# Patient Record
Sex: Male | Born: 1970 | Race: Black or African American | Hispanic: No | Marital: Married | Smoking: Former smoker
Health system: Southern US, Community
[De-identification: ages and names within clinical notes are randomized; demographics above are authoritative.]

## PROBLEM LIST (undated history)

## (undated) DIAGNOSIS — R079 Chest pain, unspecified: Secondary | ICD-10-CM

## (undated) DIAGNOSIS — K219 Gastro-esophageal reflux disease without esophagitis: Secondary | ICD-10-CM

## (undated) DIAGNOSIS — S86819A Strain of other muscle(s) and tendon(s) at lower leg level, unspecified leg, initial encounter: Secondary | ICD-10-CM

## (undated) DIAGNOSIS — G4730 Sleep apnea, unspecified: Secondary | ICD-10-CM

## (undated) DIAGNOSIS — F4310 Post-traumatic stress disorder, unspecified: Secondary | ICD-10-CM

## (undated) DIAGNOSIS — J309 Allergic rhinitis, unspecified: Secondary | ICD-10-CM

## (undated) DIAGNOSIS — J45909 Unspecified asthma, uncomplicated: Secondary | ICD-10-CM

## (undated) HISTORY — DX: Sleep apnea, unspecified: G47.30

## (undated) HISTORY — DX: Allergic rhinitis, unspecified: J30.9

## (undated) HISTORY — DX: Gastro-esophageal reflux disease without esophagitis: K21.9

## (undated) HISTORY — PX: UPPER GI ENDOSCOPY: SHX6162

---

## 2001-12-04 ENCOUNTER — Ambulatory Visit (HOSPITAL_COMMUNITY): Admission: RE | Admit: 2001-12-04 | Discharge: 2001-12-04 | Payer: Self-pay | Admitting: Specialist

## 2001-12-04 ENCOUNTER — Encounter: Payer: Self-pay | Admitting: Specialist

## 2013-11-15 ENCOUNTER — Encounter (HOSPITAL_BASED_OUTPATIENT_CLINIC_OR_DEPARTMENT_OTHER): Payer: Self-pay | Admitting: Emergency Medicine

## 2013-11-15 DIAGNOSIS — Y92838 Other recreation area as the place of occurrence of the external cause: Secondary | ICD-10-CM

## 2013-11-15 DIAGNOSIS — S5000XA Contusion of unspecified elbow, initial encounter: Secondary | ICD-10-CM | POA: Insufficient documentation

## 2013-11-15 DIAGNOSIS — Y9367 Activity, basketball: Secondary | ICD-10-CM | POA: Insufficient documentation

## 2013-11-15 DIAGNOSIS — W219XXA Striking against or struck by unspecified sports equipment, initial encounter: Secondary | ICD-10-CM | POA: Insufficient documentation

## 2013-11-15 DIAGNOSIS — S51009A Unspecified open wound of unspecified elbow, initial encounter: Secondary | ICD-10-CM | POA: Insufficient documentation

## 2013-11-15 DIAGNOSIS — Y9239 Other specified sports and athletic area as the place of occurrence of the external cause: Secondary | ICD-10-CM | POA: Insufficient documentation

## 2013-11-15 DIAGNOSIS — Z23 Encounter for immunization: Secondary | ICD-10-CM | POA: Insufficient documentation

## 2013-11-15 NOTE — ED Notes (Signed)
Laceration to his right elbow when he hit a piece of metal while playing basketball. Bleeding controlled on arrival.

## 2013-11-16 ENCOUNTER — Emergency Department (HOSPITAL_BASED_OUTPATIENT_CLINIC_OR_DEPARTMENT_OTHER)
Admission: EM | Admit: 2013-11-16 | Discharge: 2013-11-16 | Disposition: A | Discharge status: Home / Self Care | Payer: Managed Care, Other (non HMO) | Attending: Emergency Medicine | Admitting: Emergency Medicine

## 2013-11-16 DIAGNOSIS — S5001XA Contusion of right elbow, initial encounter: Secondary | ICD-10-CM

## 2013-11-16 DIAGNOSIS — S51011A Laceration without foreign body of right elbow, initial encounter: Secondary | ICD-10-CM

## 2013-11-16 MED ORDER — TETANUS-DIPHTH-ACELL PERTUSSIS 5-2.5-18.5 LF-MCG/0.5 IM SUSP
0.5000 mL | Freq: Once | INTRAMUSCULAR | Status: AC
  Administered 2013-11-16: 0.5 mL via INTRAMUSCULAR
  Filled 2013-11-16: qty 0.5

## 2013-11-16 NOTE — ED Notes (Signed)
Bacitracin applied to left elbow stitches, covered with bandaid

## 2013-11-16 NOTE — ED Provider Notes (Signed)
CSN: [id number]     Arrival date & time 11/15/13  2159 History  This chart was scribed for [name], MD by Ardelia Mems, ED Scribe. This patient was seen in room MH08/MH08 and the patient's care was started at 1:16 AM.  Chief Complaint  Patient presents with  . Laceration    The history is provided by the patient. No language interpreter was used.    HPI Comments: Kevin Arias is a 43 y.o. male who presents to the Emergency Department complaining of a laceration to his right elbow area, just distal to the olecranon, sustained earlier tonight. He states that while playing basketball he hit his elbow against a metal wall. He is unsure of when his last Tetanus vaccination was. He describes his elbow as "sore" but not "painful". The wound is hemostatic as a result of pressure.  He states that he has also had some cough and congestion over the past week which improved after taking Mucinex.   History reviewed. No pertinent past medical history. History reviewed. No pertinent past surgical history. No family history on file. History  Substance Use Topics  . Smoking status: Never Smoker   . Smokeless tobacco: Not on file  . Alcohol Use: Yes    Review of Systems A complete 10 system review of systems was obtained and all systems are negative except as noted in the HPI and PMH.   Allergies  Ciprofloxacin  Home Medications  No current outpatient prescriptions on file.  Triage Vitals: BP 122/72  Pulse 72  Temp(Src) 98.5 F (36.9 C) (Oral)  Resp 18  Ht 6\' 2"  (1.88 m)  Wt 225 lb (102.059 kg)  BMI 28.88 kg/m2  SpO2 98%  Physical Exam General: Well-developed, well-nourished male in no acute distress; appearance consistent with age of record HENT: normocephalic; atraumatic Eyes: pupils equal, round and reactive to light; extraocular muscles intact Neck: supple Heart: regular rate and rhythm; no murmurs, rubs or gallops Lungs: clear to auscultation bilaterally Abdomen: soft;  nondistended; nontender; no masses or hepatosplenomegaly; bowel sounds present Extremities: No deformity; full range of motion; pulses normal Neurologic: RUE is distally NVI; Awake, alert and oriented; motor function intact in all extremities and symmetric; no facial droop;  Skin: Warm and dry; laceration to the right elbow, just distal to the olecranon. There is surrounding tenderness and mild edema to the right elbow area Psychiatric: Normal mood and affect  ED Course  Procedures (including critical care time)  LACERATION REPAIR PROCEDURE NOTE The patient's identification was confirmed and consent was obtained. This procedure was performed by [name], MD at 1:25 AM. Site: right elbow, just distal to the olecranon Sterile procedures observed: yes Anesthetic used (type and amt): 2 mL of 2% lidocaine with epinephrine Suture type/size:4.0 Prolene Length: 3 cm # of Sutures: 5 stitches Technique: simple, interrupted Complexity: simple Antibx ointment applied: Bacitracin Tetanus UTD or ordered: ordered Site anesthetized, irrigated with NS, explored without evidence of foreign body, wound well approximated, site covered with dry, sterile dressing.  Patient tolerated procedure well without complications. Instructions for care discussed verbally and patient provided with additional written instructions for homecare and f/u.  DIAGNOSTIC STUDIES: Oxygen Saturation is 98% on RA, normal by my interpretation.    COORDINATION OF CARE: 1:19 AM- Repaired pt's laceration. Will order a Tetanus vaccination. Pt advised of plan for treatment and pt agrees.  MDM   Final diagnoses:  Laceration of right elbow  Contusion of right elbow    I personally  performed the services described in this documentation, which was scribed in my presence.  The recorded information has been reviewed and is accurate.   Hanley SeamenJohn L Brecken Walth, MD 11/16/13 [phone]

## 2014-05-05 ENCOUNTER — Encounter: Payer: Self-pay | Admitting: *Deleted

## 2014-12-24 ENCOUNTER — Ambulatory Visit: Payer: Self-pay | Admitting: Orthopedic Surgery

## 2014-12-24 NOTE — Patient Instructions (Addendum)
YOUR PROCEDURE IS SCHEDULED ON :  12/26/14  REPORT TO [facility] MAIN ENTRANCE FOLLOW SIGNS TO SHORT STAY CENTER AT : 7:30 AM  CALL THIS NUMBER IF YOU HAVE PROBLEMS THE MORNING OF SURGERY [phone]  REMEMBER:  DO NOT EAT FOOD OR DRINK LIQUIDS AFTER MIDNIGHT  TAKE THESE MEDICINES THE MORNING OF SURGERY: PROTONIX  BRING C PAP MASK AND TUBING TO HOSPITAL  YOU MAY NOT HAVE ANY METAL ON YOUR BODY INCLUDING HAIR PINS AND PIERCING'S. DO NOT WEAR JEWELRY, MAKEUP, LOTIONS, POWDERS OR PERFUMES. DO NOT WEAR NAIL POLISH. DO NOT SHAVE 48 HRS PRIOR TO SURGERY. MEN MAY SHAVE FACE AND NECK.  DO NOT BRING VALUABLES TO HOSPITAL. [facility] IS NOT RESPONSIBLE FOR VALUABLES.  CONTACTS, DENTURES OR PARTIALS MAY NOT BE WORN TO SURGERY. LEAVE SUITCASE IN CAR. CAN BE BROUGHT TO ROOM AFTER SURGERY.  PATIENTS DISCHARGED THE DAY OF SURGERY WILL NOT BE ALLOWED TO DRIVE HOME.  PLEASE READ OVER THE FOLLOWING INSTRUCTION SHEETS _________________________________________________________________________________                                          [facility] - PREPARING FOR SURGERY  Before surgery, you can play an important role.  Because skin is not sterile, your skin needs to be as free of germs as possible.  You can reduce the number of germs on your skin by washing with CHG (chlorahexidine gluconate) soap before surgery.  CHG is an antiseptic cleaner which kills germs and bonds with the skin to continue killing germs even after washing. Please DO NOT use if you have an allergy to CHG or antibacterial soaps.  If your skin becomes reddened/irritated stop using the CHG and inform your nurse when you arrive at Short Stay. Do not shave (including legs and underarms) for at least 48 hours prior to the first CHG shower.  You may shave your face. Please follow these instructions carefully:   1.  Shower with CHG Soap the night before surgery and the  morning of Surgery.   2.  If you choose  to wash your hair, wash your hair first as usual with your  normal  Shampoo.   3.  After you shampoo, rinse your hair and body thoroughly to remove the  shampoo.                                         4.  Use CHG as you would any other liquid soap.  You can apply chg directly  to the skin and wash . Gently wash with scrungie or clean wascloth    5.  Apply the CHG Soap to your body ONLY FROM THE NECK DOWN.   Do not use on open                           Wound or open sores. Avoid contact with eyes, ears mouth and genitals (private parts).                        Genitals (private parts) with your normal soap.              6.  Wash thoroughly, paying special attention to the area where  your surgery  will be performed.   7.  Thoroughly rinse your body with warm water from the neck down.   8.  DO NOT shower/wash with your normal soap after using and rinsing off  the CHG Soap .                9.  Pat yourself dry with a clean towel.             10.  Wear clean night clothes to bed after shower             11.  Place clean sheets on your bed the night of your first shower and do not  sleep with pets.  Day of Surgery : Do not apply any lotions/deodorants the morning of surgery.  Please wear clean clothes to the hospital/surgery center.  FAILURE TO FOLLOW THESE INSTRUCTIONS MAY RESULT IN THE CANCELLATION OF YOUR SURGERY    PATIENT SIGNATURE_________________________________  ______________________________________________________________________     Kevin Arias  An incentive spirometer is a tool that can help keep your lungs clear and active. This tool measures how well you are filling your lungs with each breath. Taking long deep breaths may help reverse or decrease the chance of developing breathing (pulmonary) problems (especially infection) following:  A long period of time when you are unable to move or be active. BEFORE THE PROCEDURE   If the spirometer includes an  indicator to show your best effort, your nurse or respiratory therapist will set it to a desired goal.  If possible, sit up straight or lean slightly forward. Try not to slouch.  Hold the incentive spirometer in an upright position. INSTRUCTIONS FOR USE  1. Sit on the edge of your bed if possible, or sit up as far as you can in bed or on a chair. 2. Hold the incentive spirometer in an upright position. 3. Breathe out normally. 4. Place the mouthpiece in your mouth and seal your lips tightly around it. 5. Breathe in slowly and as deeply as possible, raising the piston or the ball toward the top of the column. 6. Hold your breath for 3-5 seconds or for as long as possible. Allow the piston or ball to fall to the bottom of the column. 7. Remove the mouthpiece from your mouth and breathe out normally. 8. Rest for a few seconds and repeat Steps 1 through 7 at least 10 times every 1-2 hours when you are awake. Take your time and take a few normal breaths between deep breaths. 9. The spirometer may include an indicator to show your best effort. Use the indicator as a goal to work toward during each repetition. 10. After each set of 10 deep breaths, practice coughing to be sure your lungs are clear. If you have an incision (the cut made at the time of surgery), support your incision when coughing by placing a pillow or rolled up towels firmly against it. Once you are able to get out of bed, walk around indoors and cough well. You may stop using the incentive spirometer when instructed by your caregiver.  RISKS AND COMPLICATIONS  Take your time so you do not get dizzy or light-headed.  If you are in pain, you may need to take or ask for pain medication before doing incentive spirometry. It is harder to take a deep breath if you are having pain. AFTER USE  Rest and breathe slowly and easily.  It can be helpful to keep track of a log  of your progress. Your caregiver can provide you with a simple table  to help with this. If you are using the spirometer at home, follow these instructions: SEEK MEDICAL CARE IF:   You are having difficultly using the spirometer.  You have trouble using the spirometer as often as instructed.  Your pain medication is not giving enough relief while using the spirometer.  You develop fever of 100.5 F (38.1 C) or higher. SEEK IMMEDIATE MEDICAL CARE IF:   You cough up bloody sputum that had not been present before.  You develop fever of 102 F (38.9 C) or greater.  You develop worsening pain at or near the incision site. MAKE SURE YOU:   Understand these instructions.  Will watch your condition.  Will get help right away if you are not doing well or get worse. Document Released: 01/03/2007 Document Revised: 11/15/2011 Document Reviewed: 03/06/2007 [facility] Patient Information 2014 [employer] [place].   ________________________________________________________________________

## 2014-12-24 NOTE — H&P (Signed)
Kevin Arias is an 44 y.o. male.   Chief Complaint: R knee pain HPI: The patient is a 44 year old male who presents today for follow up of their knee. The patient is being followed for their left knee pain. They are now 17 day(s) out from injury (DOI 12/03/14). and report their pain level to be mild to moderate. Current treatment includes: bracing (knee immobilizer prn). The following medication has been used for pain control: Tylenol (prn). The patient presents today following MRI.  Kevin Arias follows up. His son plays for [employer]. He is looking at [employer].  He has had his MRI. It does demonstrate an avulsion of the patellar ligament at the tibial tubercle with some chronic changes related to his Osgood-Schlatter's. He is still having pain and difficulty extending the knee.  Past Medical History  Diagnosis Date  . Sleep apnea   . Allergic rhinitis   . GERD (gastroesophageal reflux disease)     No past surgical history on file.  Family History  Problem Relation Age of Onset  . Hypertension Mother   . Hypertension Sister   . Anuerysm Sister    Social History:  reports that he has quit smoking. He does not have any smokeless tobacco history on file. He reports that he drinks alcohol. He reports that he does not use illicit drugs.  Allergies:  Allergies  Allergen Reactions  . Ciprofloxacin     GERD     (Not in a hospital admission)  No results found for this or any previous visit (from the past 48 hour(s)). No results found.  Review of Systems  Constitutional: Negative.   HENT: Negative.   Eyes: Negative.   Respiratory: Negative.   Cardiovascular: Negative.   Gastrointestinal: Negative.   Genitourinary: Negative.   Musculoskeletal: Positive for joint pain.  Skin: Negative.   Neurological: Negative.   Psychiatric/Behavioral: Negative.     There were no vitals taken for this visit. Physical Exam  Constitutional: He is oriented to person, place, and  time. He appears well-developed and well-nourished.  HENT:  Head: Normocephalic and atraumatic.  Eyes: Conjunctivae and EOM are normal. Pupils are equal, round, and reactive to light.  Neck: Normal range of motion. Neck supple.  Cardiovascular: Normal rate and regular rhythm.   Respiratory: Effort normal and breath sounds normal.  GI: Soft. Bowel sounds are normal.  Musculoskeletal:  He is exquisitely tender to insertion of the patellar ligament and difficulty extending the knee. There is swelling over the insertion of the patellar ligament in comparison to the contralateral side.  Neurological: He is alert and oriented to person, place, and time. He has normal reflexes.  Skin: Skin is warm and dry.  Psychiatric: He has a normal mood and affect.    MRI reviewed. There is longitudinal splitting of the patellar ligament.  Assessment/Plan Split tear of the patellar ligament, partial avulsion, persistent pain three weeks status post. The patient felt a definite pop at the time of the injury.  Given some separation of the patellar ligament from the tibial tubercle and some avulsion as well as longitudinal split tears, it would be reasonable to consider a repair with a bone anchor to the tibial tubercle to assist in healing and the long-term strength of the patellar ligament. He would like proceed with. I have discussed risks and benefits including bleeding, infection, DVT, prolonged need for healing, etc with a history of a heart attack, DVT, CVA or MRSA exposure. Again his son goes to [employer]. He is looking at [employer] to play football.  Plan repair patellar tendon left knee  Legend Tumminello M. PA-C for Dr. Beane 12/24/2014, 10:37 AM    

## 2014-12-25 ENCOUNTER — Ambulatory Visit: Payer: Self-pay | Admitting: Orthopedic Surgery

## 2014-12-25 ENCOUNTER — Encounter (HOSPITAL_COMMUNITY): Payer: Self-pay

## 2014-12-25 ENCOUNTER — Encounter (HOSPITAL_COMMUNITY)
Admission: RE | Admit: 2014-12-25 | Discharge: 2014-12-25 | Disposition: A | Discharge status: Home / Self Care | Payer: Managed Care, Other (non HMO) | Source: Ambulatory Visit | Attending: Specialist | Admitting: Specialist

## 2014-12-25 DIAGNOSIS — X58XXXA Exposure to other specified factors, initial encounter: Secondary | ICD-10-CM | POA: Diagnosis not present

## 2014-12-25 DIAGNOSIS — K219 Gastro-esophageal reflux disease without esophagitis: Secondary | ICD-10-CM | POA: Diagnosis not present

## 2014-12-25 DIAGNOSIS — M719 Bursopathy, unspecified: Secondary | ICD-10-CM | POA: Diagnosis not present

## 2014-12-25 DIAGNOSIS — J45909 Unspecified asthma, uncomplicated: Secondary | ICD-10-CM | POA: Diagnosis not present

## 2014-12-25 DIAGNOSIS — Z79899 Other long term (current) drug therapy: Secondary | ICD-10-CM | POA: Diagnosis not present

## 2014-12-25 DIAGNOSIS — G4730 Sleep apnea, unspecified: Secondary | ICD-10-CM | POA: Diagnosis not present

## 2014-12-25 DIAGNOSIS — S76112A Strain of left quadriceps muscle, fascia and tendon, initial encounter: Secondary | ICD-10-CM | POA: Diagnosis not present

## 2014-12-25 DIAGNOSIS — M25562 Pain in left knee: Secondary | ICD-10-CM | POA: Diagnosis present

## 2014-12-25 DIAGNOSIS — Z87891 Personal history of nicotine dependence: Secondary | ICD-10-CM | POA: Diagnosis not present

## 2014-12-25 HISTORY — DX: Chest pain, unspecified: R07.9

## 2014-12-25 HISTORY — DX: Unspecified asthma, uncomplicated: J45.909

## 2014-12-25 HISTORY — DX: Post-traumatic stress disorder, unspecified: F43.10

## 2014-12-25 HISTORY — DX: Strain of other muscle(s) and tendon(s) at lower leg level, unspecified leg, initial encounter: S86.819A

## 2014-12-25 LAB — CBC
HEMATOCRIT: 42.4 % (ref 39.0–52.0)
HEMOGLOBIN: 13.4 g/dL (ref 13.0–17.0)
MCH: 20.5 pg — ABNORMAL LOW (ref 26.0–34.0)
MCHC: 31.6 g/dL (ref 30.0–36.0)
MCV: 64.9 fL — ABNORMAL LOW (ref 78.0–100.0)
PLATELETS: 175 10*3/uL (ref 150–400)
RBC: 6.53 MIL/uL — AB (ref 4.22–5.81)
RDW: 16.8 % — AB (ref 11.5–15.5)
WBC: 5 10*3/uL (ref 4.0–10.5)

## 2014-12-26 ENCOUNTER — Ambulatory Visit (HOSPITAL_COMMUNITY)
Admission: RE | Admit: 2014-12-26 | Discharge: 2014-12-26 | Disposition: A | Discharge status: Home / Self Care | Payer: Managed Care, Other (non HMO) | Source: Ambulatory Visit | Attending: Specialist | Admitting: Specialist

## 2014-12-26 ENCOUNTER — Encounter (HOSPITAL_COMMUNITY)
Admission: RE | Disposition: A | Discharge status: Home / Self Care | Payer: Self-pay | Source: Ambulatory Visit | Attending: Specialist

## 2014-12-26 ENCOUNTER — Encounter (HOSPITAL_COMMUNITY): Payer: Self-pay | Admitting: *Deleted

## 2014-12-26 ENCOUNTER — Ambulatory Visit (HOSPITAL_COMMUNITY): Payer: Managed Care, Other (non HMO) | Admitting: Certified Registered Nurse Anesthetist

## 2014-12-26 DIAGNOSIS — S76112A Strain of left quadriceps muscle, fascia and tendon, initial encounter: Secondary | ICD-10-CM | POA: Insufficient documentation

## 2014-12-26 DIAGNOSIS — K219 Gastro-esophageal reflux disease without esophagitis: Secondary | ICD-10-CM | POA: Insufficient documentation

## 2014-12-26 DIAGNOSIS — M719 Bursopathy, unspecified: Secondary | ICD-10-CM | POA: Insufficient documentation

## 2014-12-26 DIAGNOSIS — S86892A Other injury of other muscle(s) and tendon(s) at lower leg level, left leg, initial encounter: Secondary | ICD-10-CM

## 2014-12-26 DIAGNOSIS — Z79899 Other long term (current) drug therapy: Secondary | ICD-10-CM | POA: Insufficient documentation

## 2014-12-26 DIAGNOSIS — J45909 Unspecified asthma, uncomplicated: Secondary | ICD-10-CM | POA: Insufficient documentation

## 2014-12-26 DIAGNOSIS — X58XXXA Exposure to other specified factors, initial encounter: Secondary | ICD-10-CM | POA: Insufficient documentation

## 2014-12-26 DIAGNOSIS — G4730 Sleep apnea, unspecified: Secondary | ICD-10-CM | POA: Insufficient documentation

## 2014-12-26 DIAGNOSIS — S86912A Strain of unspecified muscle(s) and tendon(s) at lower leg level, left leg, initial encounter: Secondary | ICD-10-CM

## 2014-12-26 DIAGNOSIS — Z87891 Personal history of nicotine dependence: Secondary | ICD-10-CM | POA: Insufficient documentation

## 2014-12-26 HISTORY — DX: Other injury of other muscle(s) and tendon(s) at lower leg level, left leg, initial encounter: S86.892A

## 2014-12-26 HISTORY — PX: PATELLAR TENDON REPAIR: SHX737

## 2014-12-26 SURGERY — REPAIR, TENDON, PATELLAR
Anesthesia: General | Site: Knee | Laterality: Left

## 2014-12-26 MED ORDER — MIDAZOLAM HCL 5 MG/5ML IJ SOLN
INTRAMUSCULAR | Status: DC | PRN
  Administered 2014-12-26: 2 mg via INTRAVENOUS

## 2014-12-26 MED ORDER — METHOCARBAMOL 500 MG PO TABS
500.0000 mg | ORAL_TABLET | Freq: Three times a day (TID) | ORAL | Status: DC
  Administered 2014-12-26: 500 mg via ORAL
  Filled 2014-12-26: qty 1

## 2014-12-26 MED ORDER — DOCUSATE SODIUM 100 MG PO CAPS: 100.0000 mg | ORAL_CAPSULE | Freq: Two times a day (BID) | ORAL | Status: DC | PRN

## 2014-12-26 MED ORDER — CEFAZOLIN SODIUM-DEXTROSE 2-3 GM-% IV SOLR
INTRAVENOUS | Status: AC
  Filled 2014-12-26: qty 50

## 2014-12-26 MED ORDER — ONDANSETRON HCL 4 MG/2ML IJ SOLN
INTRAMUSCULAR | Status: DC | PRN
  Administered 2014-12-26: 4 mg via INTRAVENOUS

## 2014-12-26 MED ORDER — BUPIVACAINE HCL (PF) 0.5 % IJ SOLN
INTRAMUSCULAR | Status: DC | PRN
  Administered 2014-12-26: 5 mL

## 2014-12-26 MED ORDER — SODIUM CHLORIDE 0.9 % IR SOLN
Status: AC
  Filled 2014-12-26: qty 1

## 2014-12-26 MED ORDER — FENTANYL CITRATE (PF) 100 MCG/2ML IJ SOLN: 25.0000 ug | INTRAMUSCULAR | Status: DC | PRN

## 2014-12-26 MED ORDER — EPHEDRINE SULFATE 50 MG/ML IJ SOLN
INTRAMUSCULAR | Status: AC
  Filled 2014-12-26: qty 1

## 2014-12-26 MED ORDER — LIDOCAINE HCL (CARDIAC) 20 MG/ML IV SOLN
INTRAVENOUS | Status: DC | PRN
  Administered 2014-12-26: 100 mg via INTRAVENOUS

## 2014-12-26 MED ORDER — OXYCODONE-ACETAMINOPHEN 5-325 MG PO TABS: 1.0000 | ORAL_TABLET | ORAL | Status: DC | PRN

## 2014-12-26 MED ORDER — METHOCARBAMOL 500 MG PO TABS: 500.0000 mg | ORAL_TABLET | Freq: Three times a day (TID) | ORAL | Status: DC | PRN

## 2014-12-26 MED ORDER — FENTANYL CITRATE (PF) 100 MCG/2ML IJ SOLN
INTRAMUSCULAR | Status: DC | PRN
  Administered 2014-12-26 (×3): 50 ug via INTRAVENOUS

## 2014-12-26 MED ORDER — PROPOFOL 10 MG/ML IV BOLUS
INTRAVENOUS | Status: AC
  Filled 2014-12-26: qty 20

## 2014-12-26 MED ORDER — LACTATED RINGERS IV SOLN
INTRAVENOUS | Status: DC
  Administered 2014-12-26: 09:00:00 via INTRAVENOUS

## 2014-12-26 MED ORDER — LACTATED RINGERS IV SOLN: INTRAVENOUS | Status: DC

## 2014-12-26 MED ORDER — BUPIVACAINE-EPINEPHRINE (PF) 0.5% -1:200000 IJ SOLN
INTRAMUSCULAR | Status: DC | PRN
  Administered 2014-12-26: 13 mL

## 2014-12-26 MED ORDER — MEPERIDINE HCL 50 MG/ML IJ SOLN: 6.2500 mg | INTRAMUSCULAR | Status: DC | PRN

## 2014-12-26 MED ORDER — OXYCODONE-ACETAMINOPHEN 5-325 MG PO TABS
1.0000 | ORAL_TABLET | ORAL | Status: DC | PRN
  Administered 2014-12-26: 1 via ORAL
  Filled 2014-12-26: qty 1

## 2014-12-26 MED ORDER — BUPIVACAINE-EPINEPHRINE (PF) 0.5% -1:200000 IJ SOLN
INTRAMUSCULAR | Status: AC
  Filled 2014-12-26: qty 30

## 2014-12-26 MED ORDER — CEFAZOLIN SODIUM-DEXTROSE 2-3 GM-% IV SOLR
2.0000 g | INTRAVENOUS | Status: AC
  Administered 2014-12-26: 2 g via INTRAVENOUS

## 2014-12-26 MED ORDER — SODIUM CHLORIDE 0.9 % IJ SOLN
INTRAMUSCULAR | Status: AC
  Filled 2014-12-26: qty 10

## 2014-12-26 MED ORDER — PROMETHAZINE HCL 25 MG/ML IJ SOLN: 6.2500 mg | INTRAMUSCULAR | Status: DC | PRN

## 2014-12-26 MED ORDER — PROPOFOL 10 MG/ML IV BOLUS
INTRAVENOUS | Status: DC | PRN
  Administered 2014-12-26: 200 mg via INTRAVENOUS
  Administered 2014-12-26: 50 mg via INTRAVENOUS

## 2014-12-26 MED ORDER — PROPOFOL 10 MG/ML IV BOLUS
INTRAVENOUS | Status: AC
Start: 2014-12-26 — End: 2014-12-26
  Filled 2014-12-26: qty 20

## 2014-12-26 MED ORDER — MIDAZOLAM HCL 2 MG/2ML IJ SOLN
INTRAMUSCULAR | Status: AC
  Filled 2014-12-26: qty 2

## 2014-12-26 MED ORDER — ONDANSETRON HCL 4 MG/2ML IJ SOLN
INTRAMUSCULAR | Status: AC
  Filled 2014-12-26: qty 2

## 2014-12-26 MED ORDER — BUPIVACAINE HCL (PF) 0.5 % IJ SOLN
INTRAMUSCULAR | Status: AC
  Filled 2014-12-26: qty 30

## 2014-12-26 MED ORDER — CEPHALEXIN 500 MG PO CAPS: 500.0000 mg | ORAL_CAPSULE | Freq: Four times a day (QID) | ORAL | Status: DC

## 2014-12-26 MED ORDER — SODIUM CHLORIDE 0.9 % IR SOLN
Status: DC | PRN
  Administered 2014-12-26: 500 mL

## 2014-12-26 MED ORDER — LIDOCAINE HCL (CARDIAC) 20 MG/ML IV SOLN
INTRAVENOUS | Status: AC
  Filled 2014-12-26: qty 5

## 2014-12-26 MED ORDER — KETOROLAC TROMETHAMINE 30 MG/ML IJ SOLN
INTRAMUSCULAR | Status: AC
  Filled 2014-12-26: qty 1

## 2014-12-26 MED ORDER — FENTANYL CITRATE (PF) 250 MCG/5ML IJ SOLN
INTRAMUSCULAR | Status: AC
  Filled 2014-12-26: qty 5

## 2014-12-26 MED ORDER — KETOROLAC TROMETHAMINE 30 MG/ML IJ SOLN
INTRAMUSCULAR | Status: DC | PRN
  Administered 2014-12-26: 30 mg via INTRAVENOUS

## 2014-12-26 SURGICAL SUPPLY — 46 items
ANCHOR ROTATOR CUFF #2 (Anchor) ×2 IMPLANT
BAG SPEC THK2 15X12 ZIP CLS (MISCELLANEOUS) ×1
BAG ZIPLOCK 12X15 (MISCELLANEOUS) ×2 IMPLANT
BANDAGE ELASTIC 6 VELCRO ST LF (GAUZE/BANDAGES/DRESSINGS) IMPLANT
BANDAGE ESMARK 6X9 LF (GAUZE/BANDAGES/DRESSINGS) IMPLANT
BIT DRILL 2.8X128 (BIT) IMPLANT
BNDG ELASTIC 6X15 VLCR STRL LF (GAUZE/BANDAGES/DRESSINGS) ×2 IMPLANT
BNDG ESMARK 6X9 LF (GAUZE/BANDAGES/DRESSINGS)
CLOTH 2% CHLOROHEXIDINE 3PK (PERSONAL CARE ITEMS) ×2 IMPLANT
CUFF TOURN SGL QUICK 34 (TOURNIQUET CUFF) ×1
CUFF TRNQT CYL 34X4X40X1 (TOURNIQUET CUFF) ×1 IMPLANT
DRAPE EXTREMITY T 121X128X90 (DRAPE) ×2 IMPLANT
DRAPE POUCH INSTRU U-SHP 10X18 (DRAPES) ×2 IMPLANT
DRAPE U-SHAPE 47X51 STRL (DRAPES) ×2 IMPLANT
DRSG ADAPTIC 3X8 NADH LF (GAUZE/BANDAGES/DRESSINGS) IMPLANT
DRSG AQUACEL AG ADV 3.5X 4 (GAUZE/BANDAGES/DRESSINGS) ×2 IMPLANT
DRSG PAD ABDOMINAL 8X10 ST (GAUZE/BANDAGES/DRESSINGS) IMPLANT
DURAPREP 26ML APPLICATOR (WOUND CARE) ×2 IMPLANT
ELECT REM PT RETURN 9FT ADLT (ELECTROSURGICAL) ×2
ELECTRODE REM PT RTRN 9FT ADLT (ELECTROSURGICAL) ×1 IMPLANT
GAUZE SPONGE 4X4 12PLY STRL (GAUZE/BANDAGES/DRESSINGS) IMPLANT
GLOVE BIOGEL PI IND STRL 7.5 (GLOVE) ×1 IMPLANT
GLOVE BIOGEL PI IND STRL 8 (GLOVE) ×1 IMPLANT
GLOVE BIOGEL PI INDICATOR 7.5 (GLOVE) ×1
GLOVE BIOGEL PI INDICATOR 8 (GLOVE) ×1
GLOVE SURG SS PI 7.5 STRL IVOR (GLOVE) ×2 IMPLANT
GLOVE SURG SS PI 8.0 STRL IVOR (GLOVE) ×4 IMPLANT
GOWN STRL REUS W/TWL XL LVL3 (GOWN DISPOSABLE) ×4 IMPLANT
IMMOBILIZER KNEE 22 (SOFTGOODS) ×2 IMPLANT
MANIFOLD NEPTUNE II (INSTRUMENTS) ×2 IMPLANT
NEEDLE HYPO 21X1.5 SAFETY (NEEDLE) ×2 IMPLANT
PACK TOTAL JOINT (CUSTOM PROCEDURE TRAY) ×2 IMPLANT
PASSER SUT SWANSON 36MM LOOP (INSTRUMENTS) IMPLANT
POSITIONER SURGICAL ARM (MISCELLANEOUS) ×2 IMPLANT
STAPLER VISISTAT 35W (STAPLE) IMPLANT
STRIP CLOSURE SKIN 1/2X4 (GAUZE/BANDAGES/DRESSINGS) ×2 IMPLANT
SUT FIBERWIRE #2 38 T-5 BLUE (SUTURE)
SUT MNCRL AB 4-0 PS2 18 (SUTURE) IMPLANT
SUT PROLENE 3 0 FS 2 (SUTURE) ×2 IMPLANT
SUT VIC AB 1 CT1 27 (SUTURE) ×2
SUT VIC AB 1 CT1 27XBRD ANTBC (SUTURE) ×2 IMPLANT
SUT VIC AB 2-0 CT1 27 (SUTURE) ×2
SUT VIC AB 2-0 CT1 TAPERPNT 27 (SUTURE) ×2 IMPLANT
SUTURE FIBERWR #2 38 T-5 BLUE (SUTURE) IMPLANT
SYR 20CC LL (SYRINGE) ×2 IMPLANT
WRAP KNEE MAXI GEL POST OP (GAUZE/BANDAGES/DRESSINGS) ×2 IMPLANT

## 2014-12-26 NOTE — H&P (View-Only) (Signed)
Kevin Arias is an 43 y.o. male.   Chief Complaint: R knee pain HPI: The patient is a 44 year old male who presents today for follow up of their knee. The patient is being followed for their left knee pain. They are now 17 day(s) out from injury (DOI 12/03/14). and report their pain level to be mild to moderate. Current treatment includes: bracing (knee immobilizer prn). The following medication has been used for pain control: Tylenol (prn). The patient presents today following MRI.  Kevin Arias follows up. His son plays for [employer]. He is looking at [employer].  He has had his MRI. It does demonstrate an avulsion of the patellar ligament at the tibial tubercle with some chronic changes related to his Osgood-Schlatter's. He is still having pain and difficulty extending the knee.  Past Medical History  Diagnosis Date  . Sleep apnea   . Allergic rhinitis   . GERD (gastroesophageal reflux disease)     No past surgical history on file.  Family History  Problem Relation Age of Onset  . Hypertension Mother   . Hypertension Sister   . Anuerysm Sister    Social History:  reports that he has quit smoking. He does not have any smokeless tobacco history on file. He reports that he drinks alcohol. He reports that he does not use illicit drugs.  Allergies:  Allergies  Allergen Reactions  . Ciprofloxacin     GERD     (Not in a hospital admission)  No results found for this or any previous visit (from the past 48 hour(s)). No results found.  Review of Systems  Constitutional: Negative.   HENT: Negative.   Eyes: Negative.   Respiratory: Negative.   Cardiovascular: Negative.   Gastrointestinal: Negative.   Genitourinary: Negative.   Musculoskeletal: Positive for joint pain.  Skin: Negative.   Neurological: Negative.   Psychiatric/Behavioral: Negative.     There were no vitals taken for this visit. Physical Exam  Constitutional: He is oriented to person, place, and  time. He appears well-developed and well-nourished.  HENT:  Head: Normocephalic and atraumatic.  Eyes: Conjunctivae and EOM are normal. Pupils are equal, round, and reactive to light.  Neck: Normal range of motion. Neck supple.  Cardiovascular: Normal rate and regular rhythm.   Respiratory: Effort normal and breath sounds normal.  GI: Soft. Bowel sounds are normal.  Musculoskeletal:  He is exquisitely tender to insertion of the patellar ligament and difficulty extending the knee. There is swelling over the insertion of the patellar ligament in comparison to the contralateral side.  Neurological: He is alert and oriented to person, place, and time. He has normal reflexes.  Skin: Skin is warm and dry.  Psychiatric: He has a normal mood and affect.    MRI reviewed. There is longitudinal splitting of the patellar ligament.  Assessment/Plan Split tear of the patellar ligament, partial avulsion, persistent pain three weeks status post. The patient felt a definite pop at the time of the injury.  Given some separation of the patellar ligament from the tibial tubercle and some avulsion as well as longitudinal split tears, it would be reasonable to consider a repair with a bone anchor to the tibial tubercle to assist in healing and the long-term strength of the patellar ligament. He would like proceed with. I have discussed risks and benefits including bleeding, infection, DVT, prolonged need for healing, etc with a history of a heart attack, DVT, CVA or MRSA exposure. Again his son goes [employer]. [facility] [employer] [facility].  Plan repair patellar tendon left knee  Kevin Arias M. PA-C for Dr. Shelle IronBeane 12/24/2014, 10:37 AM

## 2014-12-26 NOTE — Progress Notes (Signed)
O.K.  For patient to go back to Short Stay after patient is in PACU for one hour- -per Dr. Acey Lavarignan.

## 2014-12-26 NOTE — Anesthesia Preprocedure Evaluation (Addendum)
Anesthesia Evaluation  Patient identified by MRN, date of birth, ID band Patient awake    Reviewed: Allergy & Precautions, NPO status , Patient's Chart, lab work & pertinent test results  Airway Mallampati: II  TM Distance: >3 FB Neck ROM: Full    Dental no notable dental hx.    Pulmonary asthma , former smoker,  breath sounds clear to auscultation  Pulmonary exam normal       Cardiovascular negative cardio ROS  Rhythm:Regular Rate:Normal     Neuro/Psych negative neurological ROS  negative psych ROS   GI/Hepatic negative GI ROS, Neg liver ROS,   Endo/Other  negative endocrine ROS  Renal/GU negative Renal ROS  negative genitourinary   Musculoskeletal negative musculoskeletal ROS (+)   Abdominal   Peds negative pediatric ROS (+)  Hematology negative hematology ROS (+)   Anesthesia Other Findings   Reproductive/Obstetrics negative OB ROS                            Anesthesia Physical Anesthesia Plan  ASA: II  Anesthesia Plan: General   Post-op Pain Management:    Induction: Intravenous  Airway Management Planned: LMA and Oral ETT  Additional Equipment:   Intra-op Plan:   Post-operative Plan: Extubation in OR  Informed Consent: I have reviewed the patients History and Physical, chart, labs and discussed the procedure including the risks, benefits and alternatives for the proposed anesthesia with the patient or authorized representative who has indicated his/her understanding and acceptance.   Dental advisory given  Plan Discussed with: CRNA  Anesthesia Plan Comments:         Anesthesia Quick Evaluation

## 2014-12-26 NOTE — Anesthesia Procedure Notes (Signed)
Procedure Name: LMA Insertion Date/Time: 12/26/2014 10:10 AM Performed by: Epimenio SarinJARVELA, Kevin Tercero R Pre-anesthesia Checklist: Patient identified, Emergency Drugs available, Suction available, Patient being monitored and Timeout performed Patient Re-evaluated:Patient Re-evaluated prior to inductionOxygen Delivery Method: Circle system utilized Preoxygenation: Pre-oxygenation with 100% oxygen Intubation Type: IV induction Ventilation: Mask ventilation without difficulty LMA: LMA with gastric port inserted LMA Size: 5.0 Number of attempts: 1 Dental Injury: Teeth and Oropharynx as per pre-operative assessment

## 2014-12-26 NOTE — Op Note (Signed)
NAMEJONAS, Kevin Arias                ACCOUNT NO.:  [id number]  MEDICAL RECORD NO.:  [id number]  LOCATION:  [facility]                         FACILITY:  [facility]  PHYSICIAN:  Jene Every, M.D.    DATE OF BIRTH:  17-Jan-1971  DATE OF PROCEDURE:  12/26/2014 DATE OF DISCHARGE:  12/26/2014                              OPERATIVE REPORT   PREOPERATIVE DIAGNOSES:  Patellar ligament tear, left knee; prepatellar bursitis.  POSTOPERATIVE DIAGNOSES:  Patellar ligament tear, left knee; prepatellar bursitis.  PROCEDURES PERFORMED: 1. Mini open repair of the patellar ligament utilized. 2. Bursal resection.  ANESTHESIA:  General.  ASSISTANT:  Lanna Poche, PA.  HISTORY:  A 44 year old, a knee injury, history of Osgood-Schlatter disease, had an avulsion tear of the patellar ligament at its insertion of tibial tubercle by MRI.  There was longitudinal splitting in the tendon and avulsion characteristics with some bony attachment.  It was indicated for reinforcement attachment of the patellar ligament and the tibial tubercle to prevent complete avulsion.  Risks and benefits discussed including bleeding, infection, suboptimal range of motion, DVT, PE, anesthetic complications, etc.  TECHNIQUE:  With the patient in supine position, after induction of adequate general anesthesia, 2 g Kefzol, left lower extremity was prepped and draped in usual sterile fashion.  The knee was slightly flexed, prominence over the tibial tubercle was noted.  I made a 3-cm incision over the tibial tubercle his previous point of maximal tenderness and swelling.  Electrocautery was utilized to achieve hemostasis.  I excised the bursa noted over the patella ligament.  At point of maximal swelling and where he was point tender, I made a longitudinal incision in the patellar ligament down to the tibial tubercle, midportion of that where the avulsion fragments were noted. Then I took a curette and explored that area.  There  was attachment distally as well as proximally, medially, and laterally, however, there was a central portion that was detached.  I curetted the bone that measured only about a half by half cm, curetted the bone.  I then used a 2-prong Mitek suture anchor and impacted into the bone.  Flushed with the countersunk.  Excellent resistance to pull out.  I then threaded those Ethibond sutures to the patellar ligament medially and laterally to deliver that portion of the tendon to the bone and tied it with a good surgical knot and oversewed the longitudinal split with #1 Vicryl interrupted figure-of-eight sutures.  Full range of motion was noted and detached.  The ligament was noted.  We explored the remainder of the ligament, above the tibial tubercle and to the patellar tendon that was intact.  A good stability of the knee under exam.  Copiously irrigated the wound, infiltrated with 0.25% Marcaine with epinephrine.  Next, we repaired the subcutaneous tissue with 2-0 Vicryl and skin with subcuticular Prolene.  Sterile dressing applied.  No tourniquet was utilized.  He was placed in immobilizer, extubated without difficulty, and transported to the recovery room in satisfactory condition.  The patient tolerated the procedure well.  No complications.  Assistant, Lanna Poche, PA, minimal blood loss.     Jene Every, M.D.     JB/MEDQ  D:  12/26/2014  T:  12/26/2014  Job:  [id number]

## 2014-12-26 NOTE — Discharge Instructions (Signed)
Weight bear as tolerated in immobilizer. Crutches as needed. May remove immobilizer when not weight bearing. May bend the knee to 45 degrees. Keep dressing on until seen in office. Take one 325mg  aspirin per day to decrease risk of blood clot. Percocet or advil for pain. See Dr. Shelle IronBeane in 2 weeks

## 2014-12-26 NOTE — Progress Notes (Signed)
Instructed to use CPAP machine at home post op

## 2014-12-26 NOTE — Anesthesia Postprocedure Evaluation (Signed)
  Anesthesia Post-op Note  Patient: Kevin BullaRalph Mcphearson  Procedure(s) Performed: Procedure(s) (LRB): PATELLA TENDON REPAIR ON THE LEFT (Left)  Patient Location: PACU  Anesthesia Type: General  Level of Consciousness: awake and alert   Airway and Oxygen Therapy: Patient Spontanous Breathing  Post-op Pain: mild  Post-op Assessment: Post-op Vital signs reviewed, Patient's Cardiovascular Status Stable, Respiratory Function Stable, Patent Airway and No signs of Nausea or vomiting  Last Vitals:  Filed Vitals:   12/26/14 1323  BP: 118/68  Pulse: 55  Temp:   Resp: 18    Post-op Vital Signs: stable   Complications: No apparent anesthesia complications

## 2014-12-26 NOTE — Transfer of Care (Signed)
Immediate Anesthesia Transfer of Care Note  Patient: Kevin BullaRalph Coggins  Procedure(s) Performed: Procedure(s): PATELLA TENDON REPAIR ON THE LEFT (Left)  Patient Location: PACU  Anesthesia Type:General  Level of Consciousness:  sedated, patient cooperative and responds to stimulation  Airway & Oxygen Therapy:Patient Spontanous Breathing and Patient connected to face mask oxgen  Post-op Assessment:  Report given to PACU RN and Post -op Vital signs reviewed and stable  Post vital signs:  Reviewed and stable  Last Vitals:  Filed Vitals:   12/26/14 0731  BP: 136/75  Pulse: 68  Temp: 36.7 C  Resp: 18    Complications: No apparent anesthesia complications

## 2014-12-26 NOTE — Brief Op Note (Signed)
12/26/2014  10:44 AM  PATIENT:  Kevin Arias  44 y.o. male  PRE-OPERATIVE DIAGNOSIS:  patella tendon tear on the left  POST-OPERATIVE DIAGNOSIS:  patella tendon tear on the left  PROCEDURE:  Procedure(s): PATELLA TENDON REPAIR ON THE LEFT (Left)  SURGEON:  Surgeon(s) and Role:    * Jene EveryJeffrey Mikias Lanz, MD - Primary  PHYSICIAN ASSISTANT:   ASSISTANTS: Bissell   ANESTHESIA:   general  EBL:     BLOOD ADMINISTERED:none  DRAINS: none   LOCAL MEDICATIONS USED:  MARCAINE     SPECIMEN:  No Specimen  DISPOSITION OF SPECIMEN:  no  COUNTS:  YES  TOURNIQUET:    DICTATION: .Other Dictation: Dictation Number [id number]  PLAN OF CARE: Discharge to home after PACU  PATIENT DISPOSITION:  PACU - hemodynamically stable.   Delay start of Pharmacological VTE agent (>24hrs) due to surgical blood loss or risk of bleeding: no

## 2014-12-26 NOTE — Interval H&P Note (Signed)
History and Physical Interval Note:  12/26/2014 7:31 AM  Kevin Arias  has presented today for surgery, with the diagnosis of patella tendon tear on the left  The various methods of treatment have been discussed with the patient and family. After consideration of risks, benefits and other options for treatment, the patient has consented to  Procedure(s): PATELLA TENDON REPAIR ON THE LEFT (Left) as a surgical intervention .  The patient's history has been reviewed, patient examined, no change in status, stable for surgery.  I have reviewed the patient's chart and labs.  Questions were answered to the patient's satisfaction.     [name] C

## 2014-12-27 ENCOUNTER — Encounter (HOSPITAL_COMMUNITY): Payer: Self-pay | Admitting: Specialist

## 2015-07-08 ENCOUNTER — Other Ambulatory Visit: Payer: Self-pay | Admitting: Family Medicine

## 2015-07-08 DIAGNOSIS — R1013 Epigastric pain: Secondary | ICD-10-CM

## 2015-07-16 ENCOUNTER — Other Ambulatory Visit: Payer: Managed Care, Other (non HMO)

## 2015-07-24 ENCOUNTER — Ambulatory Visit
Admission: RE | Admit: 2015-07-24 | Discharge: 2015-07-24 | Disposition: A | Discharge status: Home / Self Care | Payer: 59 | Source: Ambulatory Visit | Attending: Family Medicine | Admitting: Family Medicine

## 2015-07-24 DIAGNOSIS — R1013 Epigastric pain: Secondary | ICD-10-CM

## 2015-07-30 ENCOUNTER — Other Ambulatory Visit: Payer: Self-pay | Admitting: Family Medicine

## 2015-07-30 DIAGNOSIS — K7689 Other specified diseases of liver: Secondary | ICD-10-CM

## 2015-07-30 DIAGNOSIS — N2889 Other specified disorders of kidney and ureter: Secondary | ICD-10-CM

## 2015-07-30 DIAGNOSIS — T1590XA Foreign body on external eye, part unspecified, unspecified eye, initial encounter: Secondary | ICD-10-CM

## 2015-08-12 ENCOUNTER — Ambulatory Visit
Admission: RE | Admit: 2015-08-12 | Discharge: 2015-08-12 | Disposition: A | Discharge status: Home / Self Care | Payer: 59 | Source: Ambulatory Visit | Attending: Family Medicine | Admitting: Family Medicine

## 2015-08-12 DIAGNOSIS — K7689 Other specified diseases of liver: Secondary | ICD-10-CM

## 2015-08-12 DIAGNOSIS — T1590XA Foreign body on external eye, part unspecified, unspecified eye, initial encounter: Secondary | ICD-10-CM

## 2015-08-12 DIAGNOSIS — N2889 Other specified disorders of kidney and ureter: Secondary | ICD-10-CM

## 2015-08-12 MED ORDER — GADOBENATE DIMEGLUMINE 529 MG/ML IV SOLN
20.0000 mL | Freq: Once | INTRAVENOUS | Status: AC | PRN
  Administered 2015-08-12: 20 mL via INTRAVENOUS

## 2016-12-27 ENCOUNTER — Other Ambulatory Visit: Payer: Self-pay | Admitting: Cardiology

## 2016-12-27 ENCOUNTER — Ambulatory Visit
Admission: RE | Admit: 2016-12-27 | Discharge: 2016-12-27 | Disposition: A | Discharge status: Home / Self Care | Payer: 59 | Source: Ambulatory Visit | Attending: Cardiology | Admitting: Cardiology

## 2016-12-27 DIAGNOSIS — R0789 Other chest pain: Secondary | ICD-10-CM

## 2017-03-17 IMAGING — US US ABDOMEN COMPLETE
1 series · 13 of 25 positions shown · non-contrast
Comparison: None.

CLINICAL DATA: Epigastric pain

EXAM:
ULTRASOUND ABDOMEN COMPLETE

[Series 1: us abdomen complete · 0.32mm/px · 13 of 129 slices shown]
[im 1/129]
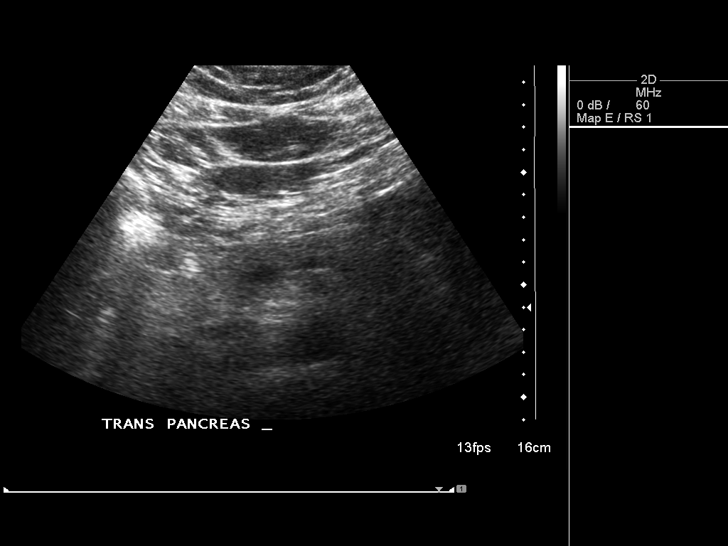
[im 11/129]
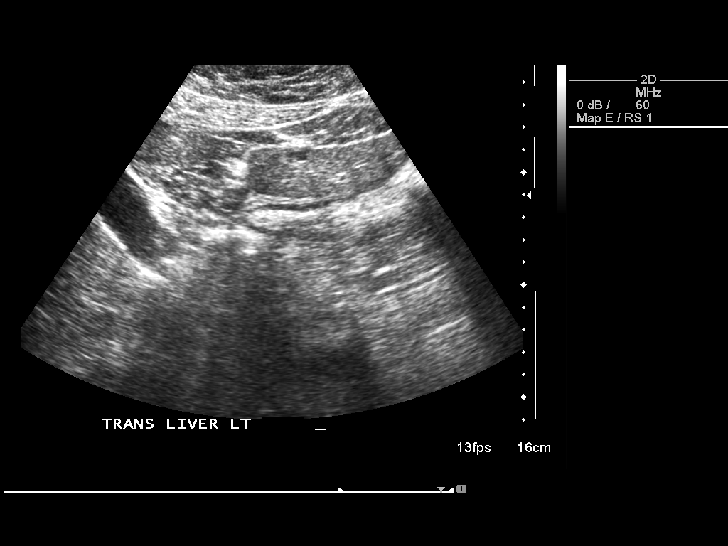
[im 22/129]
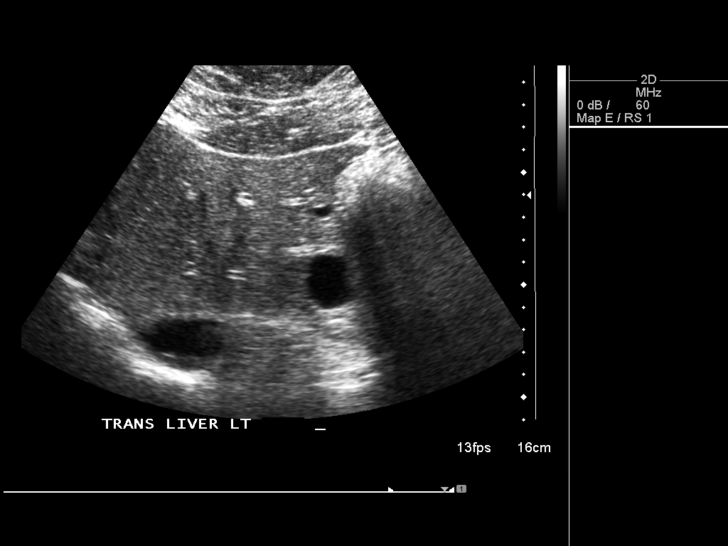
[im 33/129]
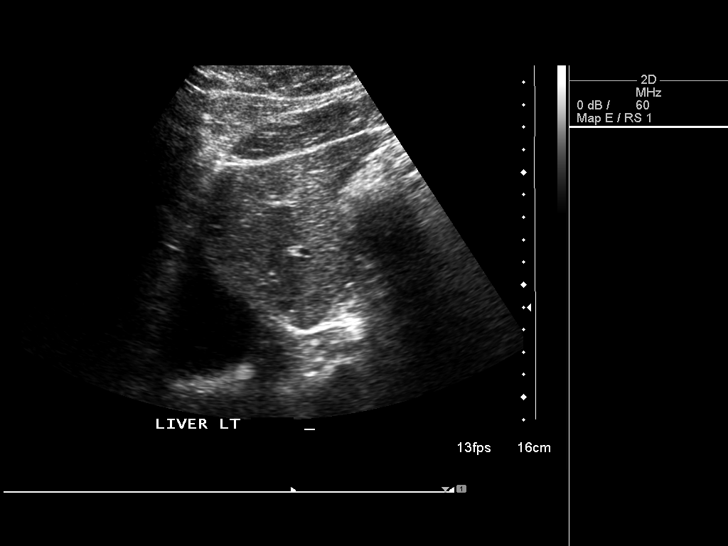
[im 43/129]
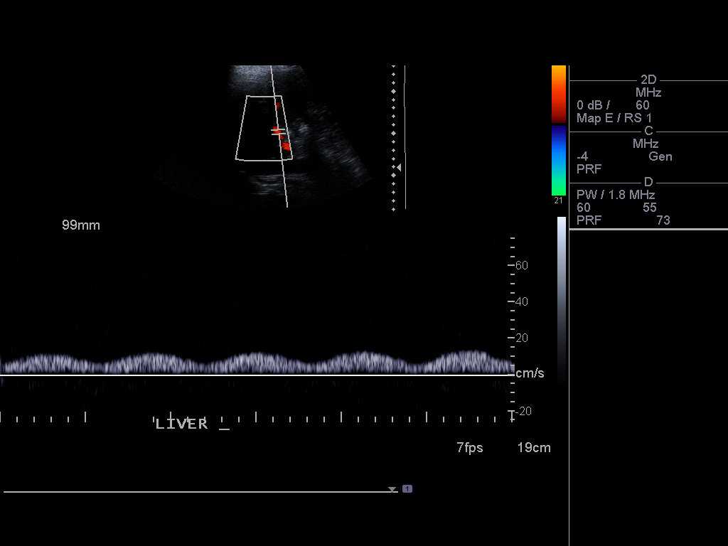
[im 54/129]
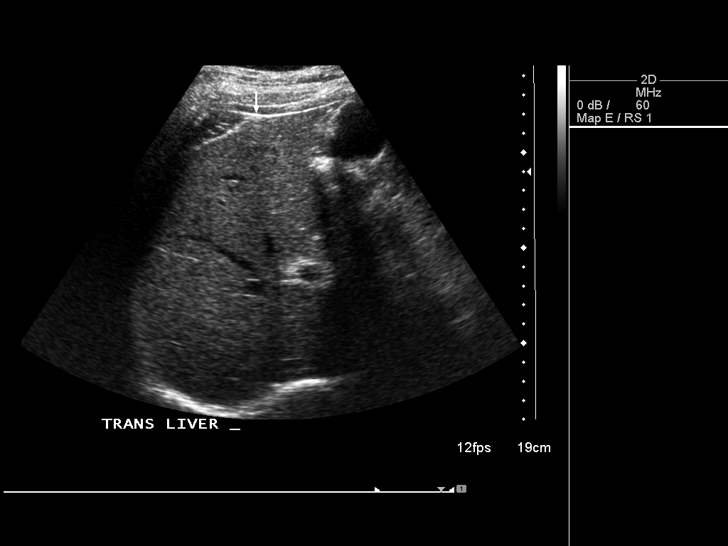
[im 65/129]
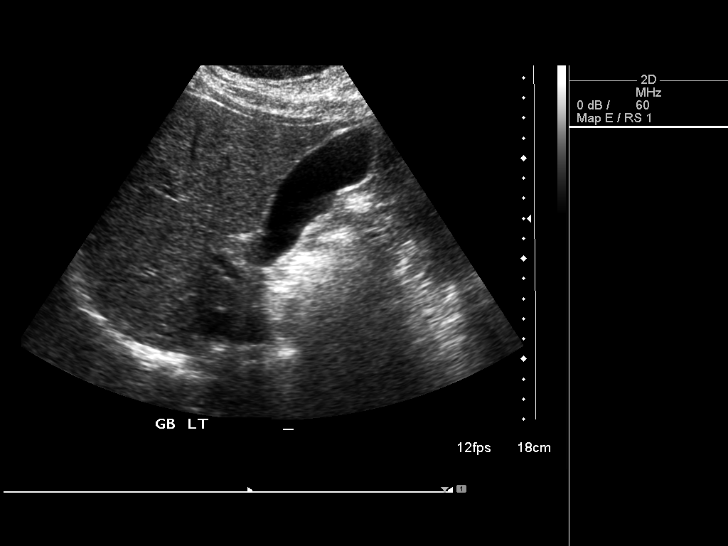
[im 75/129]
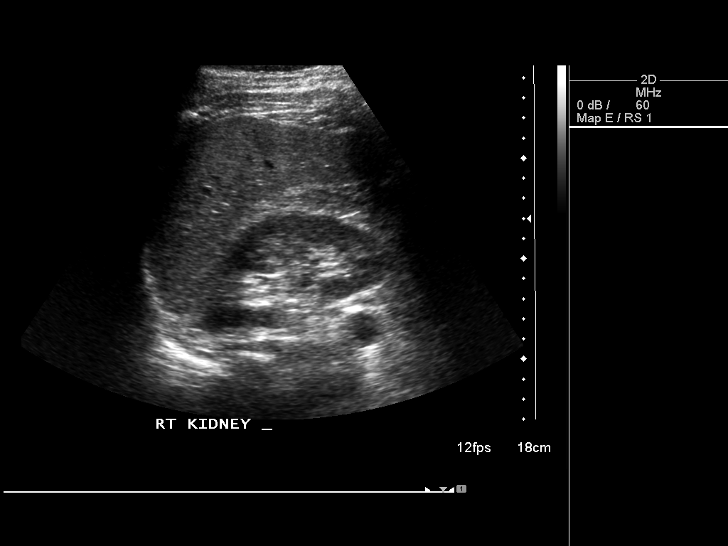
[im 86/129]
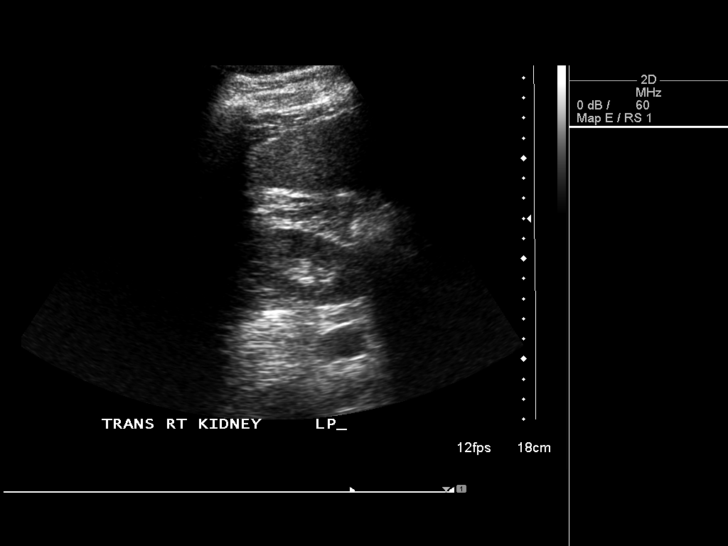
[im 97/129]
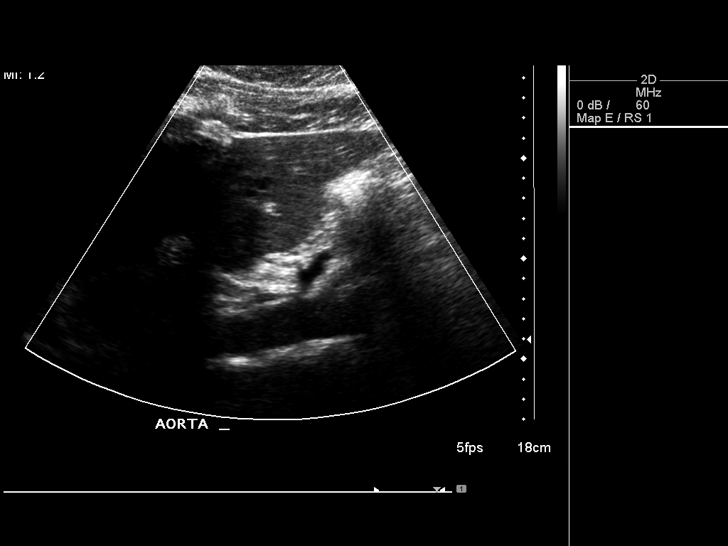
[im 107/129]
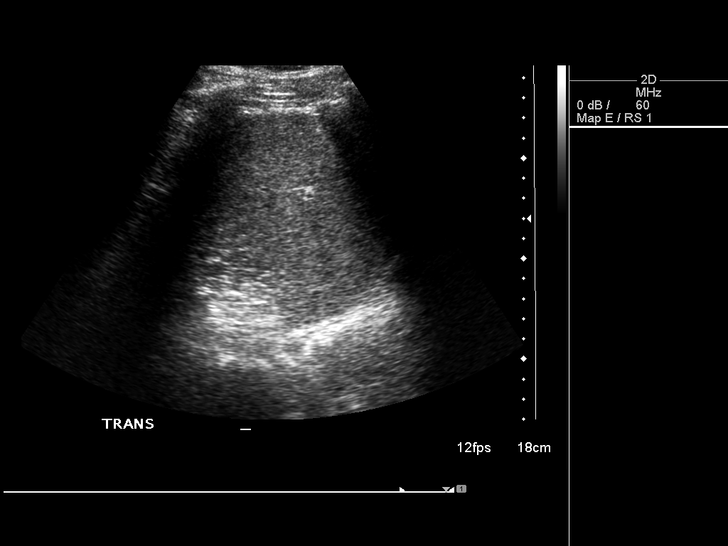
[im 118/129]
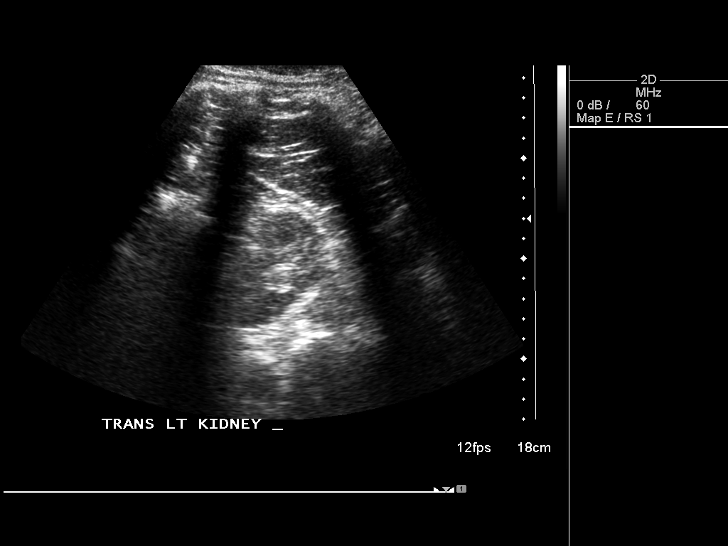
[im 129/129]
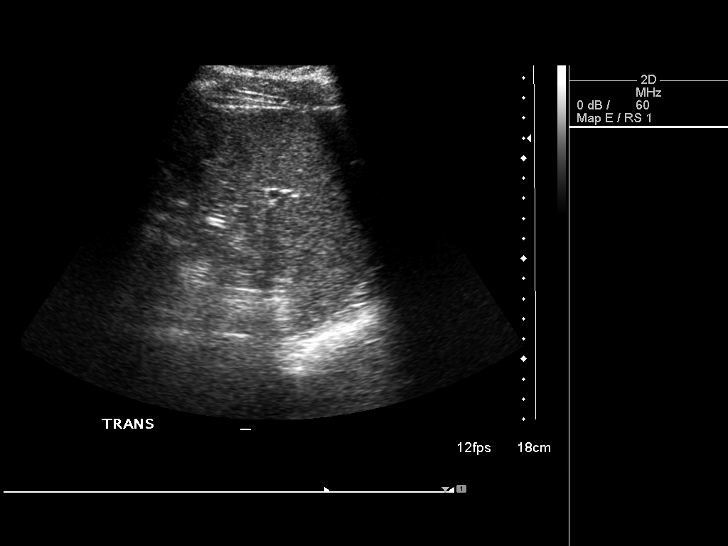

[13 of 25 positions shown; findings below may reference images not displayed]

FINDINGS: Gallbladder: No gallstones or wall thickening visualized. No
sonographic Murphy sign noted.

Common bile duct: Diameter: 4 mm.

Liver: Multiple solid and complex lesions are scattered throughout
the liver. There is 1 lesion that is suspected to be subcapsular
calling a nodular outward a convex border. See image 55.

IVC: No abnormality visualized.

Pancreas: Visualized portion unremarkable.

Spleen: Size and appearance within normal limits.

Right Kidney: Length: 10.6 cm. No hydronephrosis. There is a 2.0 cm
heterogeneous mass in the upper pole of the right kidney.
Nonspecific upper pole cortical calcification.

Left Kidney: Length: 11.3 cm. Echogenicity within normal limits. No
mass or hydronephrosis visualized.

Abdominal aorta: Maximal aortic caliber is 2.5 cm.

Other findings: None.
IMPRESSION: Multiple liver lesions. Right upper pole renal lesion. MRI of the
liver and kidneys is recommended to evaluate for mass.

Maximal aortic caliber is 2.5 cm. It is ectatic. Ectatic abdominal
aorta at risk for aneurysm development. Recommend followup by
ultrasound in 5 years. This recommendation follows ACR consensus
guidelines: White Paper of the ACR Incidental Findings Committee II

## 2018-02-15 ENCOUNTER — Encounter (HOSPITAL_BASED_OUTPATIENT_CLINIC_OR_DEPARTMENT_OTHER): Payer: Self-pay

## 2018-02-15 ENCOUNTER — Emergency Department (HOSPITAL_BASED_OUTPATIENT_CLINIC_OR_DEPARTMENT_OTHER)
Admission: EM | Admit: 2018-02-15 | Discharge: 2018-02-15 | Disposition: A | Discharge status: Home / Self Care | Payer: 59 | Attending: Emergency Medicine | Admitting: Emergency Medicine

## 2018-02-15 ENCOUNTER — Other Ambulatory Visit: Payer: Self-pay

## 2018-02-15 ENCOUNTER — Emergency Department (HOSPITAL_BASED_OUTPATIENT_CLINIC_OR_DEPARTMENT_OTHER): Payer: 59

## 2018-02-15 DIAGNOSIS — R079 Chest pain, unspecified: Secondary | ICD-10-CM | POA: Diagnosis present

## 2018-02-15 DIAGNOSIS — J45909 Unspecified asthma, uncomplicated: Secondary | ICD-10-CM | POA: Diagnosis not present

## 2018-02-15 DIAGNOSIS — R072 Precordial pain: Secondary | ICD-10-CM

## 2018-02-15 DIAGNOSIS — Z87891 Personal history of nicotine dependence: Secondary | ICD-10-CM | POA: Diagnosis not present

## 2018-02-15 LAB — BASIC METABOLIC PANEL
Anion gap: 8 (ref 5–15)
BUN: 14 mg/dL (ref 6–20)
CALCIUM: 9 mg/dL (ref 8.9–10.3)
CO2: 28 mmol/L (ref 22–32)
CREATININE: 1.15 mg/dL (ref 0.61–1.24)
Chloride: 103 mmol/L (ref 101–111)
Glucose, Bld: 71 mg/dL (ref 65–99)
Potassium: 3.9 mmol/L (ref 3.5–5.1)
SODIUM: 139 mmol/L (ref 135–145)

## 2018-02-15 LAB — CBC
HCT: 36.9 % — ABNORMAL LOW (ref 39.0–52.0)
Hemoglobin: 12.4 g/dL — ABNORMAL LOW (ref 13.0–17.0)
MCH: 20.4 pg — ABNORMAL LOW (ref 26.0–34.0)
MCHC: 33.6 g/dL (ref 30.0–36.0)
MCV: 60.8 fL — ABNORMAL LOW (ref 78.0–100.0)
PLATELETS: 182 10*3/uL (ref 150–400)
RBC: 6.07 MIL/uL — ABNORMAL HIGH (ref 4.22–5.81)
RDW: 18.8 % — AB (ref 11.5–15.5)
WBC: 5.9 10*3/uL (ref 4.0–10.5)

## 2018-02-15 LAB — TROPONIN I

## 2018-02-15 LAB — D-DIMER, QUANTITATIVE (NOT AT ARMC)

## 2018-02-15 MED ORDER — IBUPROFEN 800 MG PO TABS: 800.0000 mg | ORAL_TABLET | Freq: Three times a day (TID) | ORAL | 0 refills | Status: DC | PRN

## 2018-02-15 NOTE — Discharge Instructions (Signed)

## 2018-02-15 NOTE — ED Provider Notes (Signed)
Emergency Department Provider Note   I have reviewed the triage vital signs and the nursing notes.   HISTORY  Chief Complaint Chest Pain   HPI Kevin Arias is a 47 y.o. male with PMH of GERD and remote tobacco use history (quit 15 years prior) presents to the emergency department for evaluation of constant left-sided chest pain over the past 2 days.  Patient states he has had intermittent chest discomfort over the left chest in the past but over the past 2 days he is developed more constant discomfort.  He describes it as a pressure in the left chest and into the armpit.  He also describes a "tingling pain" in the left arm.  No injury.  He endorses no shortness of breath, fevers, chills.  He notes intermittent swelling of the right lower extremity for several years but none recently.  No pain in the leg. No pleuritic or exertional pain.   Past Medical History:  Diagnosis Date  . Allergic rhinitis   . Asthma   . Chest pain    "pinching type pain" - occasional L arm discomfort  . GERD (gastroesophageal reflux disease)   . Patellar tendon rupture   . PTSD (post-traumatic stress disorder)   . Sleep apnea    does not use his c pap ( use to use c pap)    Patient Active Problem List   Diagnosis Date Noted  . Avulsion of left patellar tendon 12/26/2014    Past Surgical History:  Procedure Laterality Date  . PATELLAR TENDON REPAIR Left 12/26/2014   Procedure: PATELLA TENDON REPAIR ON THE LEFT;  Surgeon: Jene Every, MD;  Location: [facility];  Service: Orthopedics;  Laterality: Left;  . UPPER GI ENDOSCOPY      Current Outpatient Rx  . Order #: [id number] Class: Print  . Order #: [id number] Class: Print  . Order #: [id number] Class: Historical Med  . Order #: [id number] Class: Print  . Order #: [id number] Class: Print  . Order #: [id number] Class: Historical Med  . Order #: [id number] Class: Print  . Order #: [id number] Class: Historical Med  . Order #: [id number] Class: Historical Med  . Order #:  [id number] Class: Historical Med    Allergies Ciprofloxacin  Family History  Problem Relation Age of Onset  . Hypertension Mother   . Hypertension Sister   . Anuerysm Sister     Social History Social History   Tobacco Use  . Smoking status: Former Smoker    Last attempt to quit: 12/24/2004    Years since quitting: 13.1  . Smokeless tobacco: Never Used  Substance Use Topics  . Alcohol use: Yes    Comment: occ  . Drug use: No    Review of Systems  Constitutional: No fever/chills Eyes: No visual changes. ENT: No sore throat. Cardiovascular: Positive chest pain and left arm discomfort.  Respiratory: Denies shortness of breath. Gastrointestinal: No abdominal pain.  No nausea, no vomiting.  No diarrhea.  No constipation. Genitourinary: Negative for dysuria. Musculoskeletal: Negative for back pain. Skin: Negative for rash. Neurological: Negative for headaches, focal weakness or numbness.  10-point ROS otherwise negative.  ____________________________________________   PHYSICAL EXAM:  VITAL SIGNS: ED Triage Vitals  Enc Vitals Group     BP 02/15/18 1705 139/71     Pulse Rate 02/15/18 1705 (!) 53     Resp 02/15/18 1705 18     Temp 02/15/18 1705 97.6 F (36.4 C)     Temp Source 02/15/18 1705 Oral     SpO2 02/15/18 1705 100 %  Pain Score 02/15/18 1704 0   Constitutional: Alert and oriented. Well appearing and in no acute distress. Eyes: Conjunctivae are normal.  Head: Atraumatic. Nose: No congestion/rhinnorhea. Mouth/Throat: Mucous membranes are moist. Neck: No stridor.   Cardiovascular: Normal rate, regular rhythm. Good peripheral circulation. Grossly normal heart sounds.   Respiratory: Normal respiratory effort.  No retractions. Lungs CTAB. Gastrointestinal: Soft and nontender. No distention.  Musculoskeletal: No lower extremity tenderness nor edema. No gross deformities of extremities. Neurologic:  Normal speech and language. No gross focal neurologic  deficits are appreciated.  Skin:  Skin is warm, dry and intact. No rash noted.  ____________________________________________   LABS (all labs ordered are listed, but only abnormal results are displayed)  Labs Reviewed  CBC - Abnormal; Notable for the following components:      Result Value   RBC 6.07 (*)    Hemoglobin 12.4 (*)    HCT 36.9 (*)    MCV 60.8 (*)    MCH 20.4 (*)    RDW 18.8 (*)    All other components within normal limits  BASIC METABOLIC PANEL  TROPONIN I  D-DIMER, QUANTITATIVE (NOT AT [facility])  TROPONIN I   ____________________________________________  EKG   EKG Interpretation  Date/Time:  Wednesday February 15 2018 17:01:50 EDT Ventricular Rate:  53 PR Interval:  136 QRS Duration: 94 QT Interval:  462 QTC Calculation: 433 R Axis:   65 Text Interpretation:  Sinus bradycardia Otherwise normal ECG No STEMI.  Confirmed by Alona Bene [phone] on 02/15/2018 5:07:52 PM       ____________________________________________  RADIOLOGY  Dg Chest 2 View  Result Date: 02/15/2018 CLINICAL DATA:  Left-sided chest pain radiating to left arm. Shortness of breath. Nausea. EXAM: CHEST - 2 VIEW COMPARISON:  12/27/2016 FINDINGS: The heart size and mediastinal contours are within normal limits. Both lungs are clear. The visualized skeletal structures are unremarkable. IMPRESSION: No active cardiopulmonary disease. Electronically Signed   By: Myles Rosenthal M.D.   On: 02/15/2018 18:04    ____________________________________________   PROCEDURES  Procedure(s) performed:   Procedures  None ____________________________________________   INITIAL IMPRESSION / ASSESSMENT AND PLAN / ED COURSE  Pertinent labs & imaging results that were available during my care of the patient were reviewed by me and considered in my medical decision making (see chart for details).  Patient presets to the ED with CP which has been constant over the last 36 hours but intermittent prior to that.  HEART score 2. Patient with no prior stress testing but few risk factors. Pain in the left lateral chest is somewhat re-producible. CXR and labs reviewed. Serial troponin negative. D-dimer negative.   Differential includes all life-threatening causes for chest pain. This includes but is not exclusive to acute coronary syndrome, aortic dissection, pulmonary embolism, cardiac tamponade, community-acquired pneumonia, pericarditis, musculoskeletal chest wall pain, etc.  Discussed results with patient in detail. Will refer to Cardiology. Place an ambulatory referral and discussed strict ED return precautions.   At this time, I do not feel there is any life-threatening condition present. I have reviewed and discussed all results (EKG, imaging, lab, urine as appropriate), exam findings with patient. I have reviewed nursing notes and appropriate previous records.  I feel the patient is safe to be discharged home without further emergent workup. Discussed usual and customary return precautions. Patient and family (if present) verbalize understanding and are comfortable with this plan.  Patient will follow-up with their primary care provider. If they do not have a primary care  provider, information for follow-up has been provided to them. All questions have been answered.  ____________________________________________  FINAL CLINICAL IMPRESSION(S) / ED DIAGNOSES  Final diagnoses:  Precordial chest pain    NEW OUTPATIENT MEDICATIONS STARTED DURING THIS VISIT:  Discharge Medication List as of 02/15/2018  9:01 PM    START taking these medications   Details  ibuprofen (ADVIL,MOTRIN) 800 MG tablet Take 1 tablet (800 mg total) by mouth every 8 (eight) hours as needed., Starting Wed 02/15/2018, Print        Note:  This document was prepared using Dragon voice recognition software and may include unintentional dictation errors.  Alona BeneJoshua Tonae Livolsi, MD Emergency Medicine    Denelda Akerley, Arlyss RepressJoshua G, MD 02/16/18 1340

## 2018-02-15 NOTE — ED Triage Notes (Addendum)
C/o intermittent CP x 3 days-also c/o pain swelling to right LE-NAD-steady gait

## 2018-02-15 NOTE — ED Notes (Signed)
Pt c/o tight CP to upper L chest and pain started going under L arm today. Pt endorses associated ShOB, nausea; denies diaphoresis. Pt states the pain is worse with certain movements and with deep inspiration. Pain is reproducible with palpation.

## 2018-02-24 ENCOUNTER — Ambulatory Visit (INDEPENDENT_AMBULATORY_CARE_PROVIDER_SITE_OTHER): Payer: 59 | Admitting: Cardiology

## 2018-02-24 ENCOUNTER — Encounter: Payer: Self-pay | Admitting: Cardiology

## 2018-02-24 VITALS — BP 110/66 | HR 60 | Resp 10 | Ht 74.0 in | Wt 222.8 lb

## 2018-02-24 DIAGNOSIS — K219 Gastro-esophageal reflux disease without esophagitis: Secondary | ICD-10-CM | POA: Diagnosis not present

## 2018-02-24 DIAGNOSIS — R0789 Other chest pain: Secondary | ICD-10-CM

## 2018-02-24 HISTORY — DX: Other chest pain: R07.89

## 2018-02-24 HISTORY — DX: Gastro-esophageal reflux disease without esophagitis: K21.9

## 2018-02-24 NOTE — Patient Instructions (Signed)
Medication Instructions:  Your physician recommends that you continue on your current medications as directed. Please refer to the Current Medication list given to you today.   Labwork: None  Testing/Procedures: You had an EKG today.   Your physician has requested that you have a stress echocardiogram. For further information please visit [email or web address]. Please follow instruction sheet as given.  Follow-Up: Your physician recommends that you schedule a follow-up appointment in: 1 month.  If you need a refill on your cardiac medications before your next appointment, please call your pharmacy.   Thank you for choosing [facility]! Damian Hofstra, RN [phone]    

## 2018-02-24 NOTE — Progress Notes (Signed)
Cardiology Consultation:    Date:  02/24/2018   ID:  Kevin Arias, DOB 12-18-1970, MRN [id number]  PCP:  Kevin Arias, [name], MD  Cardiologist:  Kevin Balsamobert Sevyn Paredez, MD   Referring MD: Kevin Arias, Kevin G, MD   Chief Complaint  Patient presents with  . Follow up from the ER  I have a chest pain  History of Present Illness:    Kevin BullaRalph Arias is a 47 y.o. male who is being seen today for the evaluation of his pain at the request of Long, Arlyss RepressJoshua G, MD.  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  Eventually 2 days later he started having some pain radiating towards his left shoulder at that time he decided to go to the emergency room in the emergency room his troponins were checked as well as d-dimer all were negative and he was discharged home.  Since that time he is doing well denies having any more chest pain however he points to epigastrium which is mildly tender.  He told me that before he did have some problem with his stomach.  Again he is very busy walk a lot and have to work a lot.  Took medication last time 2 years ago.  Past Medical History:  Diagnosis Date  . Allergic rhinitis   . Asthma   . Chest pain    "pinching type pain" - occasional L arm discomfort  . GERD (gastroesophageal reflux disease)   . Patellar tendon rupture   . PTSD (post-traumatic stress disorder)   . Sleep apnea    does not use his c pap ( use to use c pap)    Past Surgical History:  Procedure Laterality Date  . PATELLAR TENDON REPAIR Left 12/26/2014   Procedure: PATELLA TENDON REPAIR ON THE LEFT;   Surgeon: Jene EveryJeffrey Beane, MD;  Location: [facility];  Service: Orthopedics;  Laterality: Left;  . UPPER GI ENDOSCOPY      Current Medications: Current Meds  Medication Sig  . fluticasone (FLONASE) 50 MCG/ACT nasal spray Place 1 spray into both nostrils daily as needed for allergies or rhinitis.  Marland Kitchen. ibuprofen (ADVIL,MOTRIN) 800 MG tablet Take 1 tablet (800 mg total) by mouth every 8 (eight) hours as needed.  . Multiple Vitamin (MULTIVITAMIN WITH MINERALS) TABS tablet Take 1 tablet by mouth daily. Mens health pack of multi vitamins     Allergies:   Ciprofloxacin   Social History   Socioeconomic History  . Marital status: Married    Spouse name: Not on file  . Number of children: Not on file  . Years of education: Not on file  . Highest education level: Not on file  Occupational History  . Not on file  Social Needs  . Financial resource strain: Not on file  . Food insecurity:    Worry: Not on file    Inability: Not on file  . Transportation needs:    Medical: Not on file    Non-medical: Not on file  Tobacco Use  . Smoking status: Former Smoker  Last attempt to quit: 12/24/2004    Years since quitting: 13.1  . Smokeless tobacco: Never Used  Substance and Sexual Activity  . Alcohol use: Yes    Comment: occ  . Drug use: No  . Sexual activity: Not on file  Lifestyle  . Physical activity:    Days per week: Not on file    Minutes per session: Not on file  . Stress: Not on file  Relationships  . Social connections:    Talks on phone: Not on file    Gets together: Not on file    Attends religious service: Not on file    Active member of club or organization: Not on file    Attends meetings of clubs or organizations: Not on file    Relationship status: Not on file  Other Topics Concern  . Not on file  Social History Narrative  . Not on file     Family History: The patient's family history includes Anuerysm in his sister; Hypertension in his mother and sister. ROS:     Please see the history of present illness.    All 14 point review of systems negative except as described per history of present illness.  EKGs/Labs/Other Studies Reviewed:    The following studies were reviewed today:   EKG:  EKG is  ordered today.  The ekg ordered today demonstrates sinus bradycardia rate of 57 normal P interval normal QS complex duration morphology nonspecific ST segment changes  Recent Labs: 02/15/2018: BUN 14; Creatinine, Ser 1.15; Hemoglobin 12.4; Platelets 182; Potassium 3.9; Sodium 139  Recent Lipid Panel No results found for: CHOL, TRIG, HDL, CHOLHDL, VLDL, LDLCALC, LDLDIRECT  Physical Exam:    VS:  BP 110/66   Pulse 60   Resp 10   Ht 6\' 2"  (1.88 m)   Wt 222 lb 12.8 oz (101.1 kg)   BMI 28.61 kg/m     Wt Readings from Last 3 Encounters:  02/24/18 222 lb 12.8 oz (101.1 kg)  12/26/14 232 lb (105.2 kg)  12/25/14 232 lb (105.2 kg)     GEN:  Well nourished, well developed in no acute distress HEENT: Normal NECK: No JVD; No carotid bruits LYMPHATICS: No lymphadenopathy CARDIAC: RRR, no murmurs, no rubs, no gallops RESPIRATORY:  Clear to auscultation without rales, wheezing or rhonchi  ABDOMEN: Soft, non-tender, non-distended MUSCULOSKELETAL:  No edema; No deformity  SKIN: Warm and dry NEUROLOGIC:  Alert and oriented x 3 PSYCHIATRIC:  Normal affect   ASSESSMENT:    1. Atypical chest pain   2. Gastroesophageal reflux disease without esophagitis    PLAN:    In order of problems listed above:  1. Atypical chest pain.  He does have few risk factors for coronary artery disease.  He is an ex-smoker.  He was exposed also to some toxins while he was in [place].  His mother did have coronary artery disease prematurely.  I will schedule him to have a stress echocardiogram to make sure that he got no inducible ischemia if this test will be negative then we will consider a GI problem as potential etiology for his symptoms. 2. Gastroesophageal reflux disease he  tried omeprazole and Zantac with some relieve in my opinion if cardiac work-up will be negative he will require to GI evaluation.     Medication Adjustments/Labs and Tests Ordered: Current medicines are reviewed at length with the patient today.  Concerns regarding medicines are outlined above.  No orders of the defined types were placed in  this encounter.  No orders of the defined types were placed in this encounter.   Signed, Georgeanna Lea, MD, [facility]. 02/24/2018 11:36 AM    [facility]

## 2018-03-21 ENCOUNTER — Ambulatory Visit (HOSPITAL_BASED_OUTPATIENT_CLINIC_OR_DEPARTMENT_OTHER)
Admission: RE | Admit: 2018-03-21 | Discharge: 2018-03-21 | Disposition: A | Discharge status: Home / Self Care | Payer: 59 | Source: Ambulatory Visit | Attending: Family Medicine | Admitting: Family Medicine

## 2018-03-21 DIAGNOSIS — R0789 Other chest pain: Secondary | ICD-10-CM | POA: Insufficient documentation

## 2018-03-21 NOTE — Progress Notes (Signed)
Echocardiogram Echocardiogram Stress Test with limited exam has been performed.  Kevin Arias, Kevin Arias M 03/21/2018, 3:18 PM

## 2018-03-28 ENCOUNTER — Ambulatory Visit (INDEPENDENT_AMBULATORY_CARE_PROVIDER_SITE_OTHER): Payer: 59 | Admitting: Cardiology

## 2018-03-28 ENCOUNTER — Encounter: Payer: Self-pay | Admitting: Cardiology

## 2018-03-28 VITALS — BP 128/70 | HR 52 | Ht 74.0 in | Wt 228.1 lb

## 2018-03-28 DIAGNOSIS — R0789 Other chest pain: Secondary | ICD-10-CM | POA: Diagnosis not present

## 2018-03-28 DIAGNOSIS — K219 Gastro-esophageal reflux disease without esophagitis: Secondary | ICD-10-CM

## 2018-03-28 NOTE — Progress Notes (Signed)
Cardiology Office Note:    Date:  03/28/2018   ID:  Kevin BullaRalph Arias, DOB Jan 22, 1971, MRN [id number]  PCP:  Kevin BlamerHarris, [name], MD  Cardiologist:  Kevin Balsamobert Darianny Momon, MD    Referring MD: Kevin BlamerHarris, [name], MD   Chief Complaint  Patient presents with  . 1 month follow up  Doing well  History of Present Illness:    Kevin Arias is a 47 y.o. male with atypical chest pain.  He ended up going to the emergency room troponin was normal d-dimer was negative he was sent to us for evaluation I did stress test on him he was very well on the treadmill stress test was negative no evidence of ischemia we spent time talking today about risk factors modification good diet exercises on the regular basis that he will do.  Past Medical History:  Diagnosis Date  . Allergic rhinitis   . Asthma   . Chest pain    "pinching type pain" - occasional L arm discomfort  . GERD (gastroesophageal reflux disease)   . Patellar tendon rupture   . PTSD (post-traumatic stress disorder)   . Sleep apnea    does not use his c pap ( use to use c pap)    Past Surgical History:  Procedure Laterality Date  . PATELLAR TENDON REPAIR Left 12/26/2014   Procedure: PATELLA TENDON REPAIR ON THE LEFT;  Surgeon: Kevin EveryJeffrey Beane, MD;  Location: [facility];  Service: Orthopedics;  Laterality: Left;  . UPPER GI ENDOSCOPY      Current Medications: Current Meds  Medication Sig  . fluticasone (FLONASE) 50 MCG/ACT nasal spray Place 1 spray into both nostrils daily as needed for allergies or rhinitis.  Marland Kitchen. ibuprofen (ADVIL,MOTRIN) 800 MG tablet Take 1 tablet (800 mg total) by mouth every 8 (eight) hours as needed.  . Multiple Vitamin (MULTIVITAMIN WITH MINERALS) TABS tablet Take 1 tablet by mouth daily. Mens health pack of multi vitamins     Allergies:   Ciprofloxacin   Social History   Socioeconomic History  . Marital status: Married    Spouse name: Not on file  . Number of children: Not on file  . Years of education: Not on file  .  Highest education level: Not on file  Occupational History  . Not on file  Social Needs  . Financial resource strain: Not on file  . Food insecurity:    Worry: Not on file    Inability: Not on file  . Transportation needs:    Medical: Not on file    Non-medical: Not on file  Tobacco Use  . Smoking status: Former Smoker    Last attempt to quit: 12/24/2004    Years since quitting: 13.2  . Smokeless tobacco: Never Used  Substance and Sexual Activity  . Alcohol use: Yes    Comment: occ  . Drug use: No  . Sexual activity: Not on file  Lifestyle  . Physical activity:    Days per week: Not on file    Minutes per session: Not on file  . Stress: Not on file  Relationships  . Social connections:    Talks on phone: Not on file    Gets together: Not on file    Attends religious service: Not on file    Active member of club or organization: Not on file    Attends meetings of clubs or organizations: Not on file    Relationship status: Not on file  Other Topics Concern  . Not on file  Social  History Narrative  . Not on file     Family History: The patient's family history includes Kevin Arias; Kevin Arias and Arias. ROS:   Please see the history of present illness.    All 14 point review of systems negative except as described per history of present illness  EKGs/Labs/Other Studies Reviewed:      Recent Labs: 02/15/2018: BUN 14; Creatinine, Ser 1.15; Hemoglobin 12.4; Platelets 182; Potassium 3.9; Sodium 139  Recent Lipid Panel No results found for: CHOL, TRIG, HDL, CHOLHDL, VLDL, LDLCALC, LDLDIRECT  Physical Exam:    VS:  BP 128/70   Pulse (!) 52   Ht 6\' 2"  (1.88 m)   Wt 228 lb 1.9 oz (103.5 kg)   SpO2 98%   BMI 29.29 kg/m     Wt Readings from Last 3 Encounters:  03/28/18 228 lb 1.9 oz (103.5 kg)  02/24/18 222 lb 12.8 oz (101.1 kg)  12/26/14 232 lb (105.2 kg)     GEN:  Well nourished, well developed in no acute distress HEENT:  Normal NECK: No JVD; No carotid bruits LYMPHATICS: No lymphadenopathy CARDIAC: RRR, no murmurs, no rubs, no gallops RESPIRATORY:  Clear to auscultation without rales, wheezing or rhonchi  ABDOMEN: Soft, non-tender, non-distended MUSCULOSKELETAL:  No edema; No deformity  SKIN: Warm and dry LOWER EXTREMITIES: no swelling NEUROLOGIC:  Alert and oriented x 3 PSYCHIATRIC:  Normal affect   ASSESSMENT:    1. Atypical chest pain   2. Gastroesophageal reflux disease without esophagitis    PLAN:    In order of problems listed above:  1. Atypical chest pain: Stress test negative.  We spent time talking about risk factors modifications 2. Gastroesophageal reflux disease: He is taking medication for the right with good relief.  He decided to get elliptical and start exercising on the regular basis which I strongly advised.   Medication Adjustments/Labs and Tests Ordered: Current medicines are reviewed at length with the patient today.  Concerns regarding medicines are outlined above.  No orders of the defined types were placed in this encounter.  Medication changes: No orders of the defined types were placed in this encounter.   Signed, Georgeanna Lea, MD, [facility] 03/28/2018 4:08 PM    [facility]

## 2018-03-28 NOTE — Patient Instructions (Signed)
Medication Instructions:  Your physician recommends that you continue on your current medications as directed. Please refer to the Current Medication list given to you today.   Labwork: None  Testing/Procedures: None  Follow-Up: Your physician wants you to follow-up in: 1 year. You will receive a reminder letter in the mail two months in advance. If you don't receive a letter, please call our office to schedule the follow-up appointment.   If you need a refill on your cardiac medications before your next appointment, please call your pharmacy.   Thank you for choosing [facility]! Catherine Lockhart, RN [phone]    

## 2018-11-09 ENCOUNTER — Other Ambulatory Visit: Payer: Self-pay | Admitting: Family Medicine

## 2018-11-09 DIAGNOSIS — R0789 Other chest pain: Secondary | ICD-10-CM

## 2018-11-10 ENCOUNTER — Ambulatory Visit
Admission: RE | Admit: 2018-11-10 | Discharge: 2018-11-10 | Disposition: A | Discharge status: Home / Self Care | Payer: 59 | Source: Ambulatory Visit | Attending: Family Medicine | Admitting: Family Medicine

## 2018-11-10 DIAGNOSIS — R0789 Other chest pain: Secondary | ICD-10-CM

## 2019-03-14 ENCOUNTER — Ambulatory Visit (INDEPENDENT_AMBULATORY_CARE_PROVIDER_SITE_OTHER): Payer: 59 | Admitting: Cardiology

## 2019-03-14 ENCOUNTER — Encounter: Payer: Self-pay | Admitting: Cardiology

## 2019-03-14 ENCOUNTER — Other Ambulatory Visit: Payer: Self-pay

## 2019-03-14 VITALS — BP 120/62 | HR 53 | Ht 74.0 in | Wt 223.4 lb

## 2019-03-14 DIAGNOSIS — R0789 Other chest pain: Secondary | ICD-10-CM | POA: Diagnosis not present

## 2019-03-14 DIAGNOSIS — Z13220 Encounter for screening for lipoid disorders: Secondary | ICD-10-CM

## 2019-03-14 DIAGNOSIS — K219 Gastro-esophageal reflux disease without esophagitis: Secondary | ICD-10-CM

## 2019-03-14 NOTE — Progress Notes (Signed)
Cardiology Office Note:    Date:  03/14/2019   ID:  Kevin Arias, DOB [birth year], MRN [id number]  PCP:  [name], [name], MD  Cardiologist:  Gypsy Balsamobert Krasowski, MD  Electrophysiologist:  None   Referring MD: [name], [name], MD   Chief Complaint: 48 yo male presents for annual follow up of atypical chest pain and cardiac risk stratification.  History of Present Illness:    Kevin BullaRalph Arias is a 48 y.o. male with a hx of asthma, chest pain, GERD, OSA.  Comes today to office to talk about risk factors modifications.  Overall cardiac wise doing well.  Denies have any chest pain tightness squeezing pressure burning chest.  We spent a great deal of time talking about healthy lifestyle exercises on the regular basis proper way to relax he did purchase heart tablet he is enjoying this tremendously on top of that we did talk about yoga and need to exercise on a regular basis.  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  He clearly needs some way to relax.  Denies having any cardiac complaints.  Past Medical History:  Diagnosis Date  . Allergic rhinitis   . Asthma   . Chest pain    "pinching type pain" - occasional L arm discomfort  . GERD (gastroesophageal reflux disease)   . Patellar tendon rupture   . PTSD (post-traumatic stress disorder)   . Sleep apnea    does not use his c pap ( use to use c pap)    Past Surgical History:  Procedure Laterality Date  . PATELLAR TENDON REPAIR Left 12/26/2014   Procedure: PATELLA TENDON REPAIR ON THE LEFT;  Surgeon: Jene EveryJeffrey Beane, MD;  Location: [facility];  Service: Orthopedics;  Laterality: Left;  . UPPER GI ENDOSCOPY      Current Medications: Current Meds  Medication Sig  . fluticasone (FLONASE) 50 MCG/ACT nasal spray Place 1 spray into both nostrils daily as needed for allergies or rhinitis.  . Multiple Vitamin (MULTIVITAMIN WITH MINERALS) TABS tablet Take 1 tablet by mouth daily. Mens health pack of multi vitamins      Allergies:   Ciprofloxacin   Social History   Socioeconomic History  . Marital status: Married    Spouse name: Not on file  . Number of children: Not on file  . Years of education: Not on file  . Highest education level: Not on file  Occupational History  . Not on file  Social Needs  . Financial resource strain: Not on file  . Food insecurity    Worry: Not on file    Inability: Not on file  . Transportation needs    Medical: Not on file    Non-medical: Not on file  Tobacco Use  . Smoking status: Former Smoker    Quit date: 12/24/2004    Years since quitting: 14.2  . Smokeless tobacco: Never Used  Substance and Sexual Activity  . Alcohol use: Yes    Comment: occ  . Drug use: No  . Sexual activity: Not on file  Lifestyle  . Physical activity    Days per week: Not on file    Minutes per session: Not on file  . Stress: Not on file  Relationships  . Social Musicianconnections    Talks on phone: Not on file    Gets together: Not on file    Attends religious service: Not on file    Active member of club or organization: Not  on file    Attends meetings of clubs or organizations: Not on file    Relationship status: Not on file  Other Topics Concern  . Not on file  Social History Narrative  . Not on file     Family History: The patient's family history includes Anuerysm in his sister; Hypertension in his mother and sister.  ROS:   Please see the history of present illness.     All other systems reviewed and are negative.  EKGs/Labs/Other Studies Reviewed:    The following studies were reviewed today:   EKG:  EKG is  ordered today.  The ekg ordered today demonstrates sinus bradycardia normal P interval normal QS complex duration morphology  Recent Labs: No results found for requested labs within last 8760 hours.   Recent Lipid Panel No results found for: CHOL, TRIG, HDL, CHOLHDL, VLDL, LDLCALC, LDLDIRECT  Physical Exam:    VS:  BP 120/62   Pulse (!) 53   Ht 6'  2" (1.88 m)   Wt 223 lb 6.4 oz (101.3 kg)   SpO2 98%   BMI 28.68 kg/m     Wt Readings from Last 3 Encounters:  03/14/19 223 lb 6.4 oz (101.3 kg)  03/28/18 228 lb 1.9 oz (103.5 kg)  02/24/18 222 lb 12.8 oz (101.1 kg)     GEN:  Well nourished, well developed in no acute distress HEENT: Normal NECK: No JVD; No carotid bruits LYMPHATICS: No lymphadenopathy CARDIAC: RRR, no murmurs, rubs, gallops RESPIRATORY:   Clear to auscultation without rales, wheezing or rhonchi  ABDOMEN: Soft, non-tender, non-distended MUSCULOSKELETAL:  No  edema; No deformity  SKIN: Warm and dry NEUROLOGIC:  Alert and oriented x 3 PSYCHIATRIC:  Normal affect   ASSESSMENT:    1. Atypical chest pain   2. Gastroesophageal reflux disease without esophagitis   3. Screening cholesterol level    PLAN:    In order of problems listed above:  1. Atypical chest pain.  Denies having any.  He did have a stress test done last year did excellently asymptomatic he is to modify his risk factors. 2. Dyslipidemia.  We will do fasting lipid profile today. 3. GERD stable without any symptoms  We spent well visit talking about risk factors modification he is very interested in the topic and we got very nice reduction discussion   Medication Adjustments/Labs and Tests Ordered: Current medicines are reviewed at length with the patient today.  Concerns regarding medicines are outlined above.  Orders Placed This Encounter  Procedures  . Lipid Profile   No orders of the defined types were placed in this encounter.   There are no Patient Instructions on file for this visit.   Signed, Jenne Campus, MD  03/14/2019 3:05 PM    [facility]

## 2019-03-14 NOTE — Patient Instructions (Signed)
Medication Instructions:  Your physician recommends that you continue on your current medications as directed. Please refer to the Current Medication list given to you today.  If you need a refill on your cardiac medications before your next appointment, please call your pharmacy.   Lab work: Your physician recommends that you return for lab work in: TODAY Fasting Lipid  If you have labs (blood work) drawn today and your tests are completely normal, you will receive your results only by: Marland Kitchen MyChart Message (if you have MyChart) OR . A paper copy in the mail If you have any lab test that is abnormal or we need to change your treatment, we will call you to review the results.  Testing/Procedures: None  Follow-Up: At [facility], you and your health needs are our priority.  As part of our continuing mission to provide you with exceptional heart care, we have created designated Provider Care Teams.  These Care Teams include your primary Cardiologist (physician) and Advanced Practice Providers (APPs -  Physician Assistants and Nurse Practitioners) who all work together to provide you with the care you need, when you need it. You will need a follow up appointment in 1 years.  Any Other Special Instructions Will Be Listed Below (If Applicable).

## 2019-03-15 LAB — LIPID PANEL
Chol/HDL Ratio: 2.9 ratio (ref 0.0–5.0)
Cholesterol, Total: 178 mg/dL (ref 100–199)
HDL: 62 mg/dL (ref >39)
LDL Calculated: 89 mg/dL (ref 0–99)
Triglycerides: 134 mg/dL (ref 0–149)
VLDL Cholesterol Cal: 27 mg/dL (ref 5–40)

## 2019-11-20 ENCOUNTER — Other Ambulatory Visit: Payer: Self-pay

## 2019-11-20 ENCOUNTER — Emergency Department (HOSPITAL_BASED_OUTPATIENT_CLINIC_OR_DEPARTMENT_OTHER)
Admission: EM | Admit: 2019-11-20 | Discharge: 2019-11-20 | Disposition: A | Discharge status: Home / Self Care | Payer: 59 | Attending: Emergency Medicine | Admitting: Emergency Medicine

## 2019-11-20 ENCOUNTER — Encounter (HOSPITAL_BASED_OUTPATIENT_CLINIC_OR_DEPARTMENT_OTHER): Payer: Self-pay | Admitting: *Deleted

## 2019-11-20 DIAGNOSIS — M542 Cervicalgia: Secondary | ICD-10-CM | POA: Diagnosis not present

## 2019-11-20 DIAGNOSIS — Z79899 Other long term (current) drug therapy: Secondary | ICD-10-CM | POA: Diagnosis not present

## 2019-11-20 DIAGNOSIS — M545 Low back pain: Secondary | ICD-10-CM | POA: Diagnosis not present

## 2019-11-20 DIAGNOSIS — Z87891 Personal history of nicotine dependence: Secondary | ICD-10-CM | POA: Insufficient documentation

## 2019-11-20 DIAGNOSIS — Z881 Allergy status to other antibiotic agents status: Secondary | ICD-10-CM | POA: Insufficient documentation

## 2019-11-20 DIAGNOSIS — J45909 Unspecified asthma, uncomplicated: Secondary | ICD-10-CM | POA: Diagnosis not present

## 2019-11-20 MED ORDER — DIAZEPAM 5 MG PO TABS
5.0000 mg | ORAL_TABLET | Freq: Once | ORAL | Status: AC
  Administered 2019-11-20: 11:00:00 5 mg via ORAL
  Filled 2019-11-20: qty 1

## 2019-11-20 MED ORDER — CYCLOBENZAPRINE HCL 10 MG PO TABS: 10.0000 mg | ORAL_TABLET | Freq: Two times a day (BID) | ORAL | 0 refills | Status: DC | PRN

## 2019-11-20 MED ORDER — IBUPROFEN 400 MG PO TABS
600.0000 mg | ORAL_TABLET | Freq: Once | ORAL | Status: AC
  Administered 2019-11-20: 600 mg via ORAL
  Filled 2019-11-20: qty 1

## 2019-11-20 MED ORDER — ACETAMINOPHEN 325 MG PO TABS
650.0000 mg | ORAL_TABLET | Freq: Once | ORAL | Status: AC
  Administered 2019-11-20: 650 mg via ORAL
  Filled 2019-11-20: qty 2

## 2019-11-20 MED FILL — CYCLOBENZAPRINE HCL 10 MG T: 10 | 5 days supply | Qty: 10 | Fill #0

## 2019-11-20 NOTE — ED Notes (Signed)
ED Provider at bedside. 
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
## 2019-11-26 ENCOUNTER — Emergency Department (HOSPITAL_BASED_OUTPATIENT_CLINIC_OR_DEPARTMENT_OTHER)
Admission: EM | Admit: 2019-11-26 | Discharge: 2019-11-26 | Disposition: A | Discharge status: Home / Self Care | Payer: No Typology Code available for payment source | Attending: Emergency Medicine | Admitting: Emergency Medicine

## 2019-11-26 ENCOUNTER — Emergency Department (HOSPITAL_BASED_OUTPATIENT_CLINIC_OR_DEPARTMENT_OTHER): Payer: No Typology Code available for payment source

## 2019-11-26 ENCOUNTER — Encounter (HOSPITAL_BASED_OUTPATIENT_CLINIC_OR_DEPARTMENT_OTHER): Payer: Self-pay

## 2019-11-26 ENCOUNTER — Other Ambulatory Visit: Payer: Self-pay

## 2019-11-26 DIAGNOSIS — M545 Low back pain: Secondary | ICD-10-CM | POA: Diagnosis not present

## 2019-11-26 DIAGNOSIS — S161XXD Strain of muscle, fascia and tendon at neck level, subsequent encounter: Secondary | ICD-10-CM | POA: Diagnosis not present

## 2019-11-26 DIAGNOSIS — J45909 Unspecified asthma, uncomplicated: Secondary | ICD-10-CM | POA: Diagnosis not present

## 2019-11-26 DIAGNOSIS — Z881 Allergy status to other antibiotic agents status: Secondary | ICD-10-CM | POA: Diagnosis not present

## 2019-11-26 DIAGNOSIS — M542 Cervicalgia: Secondary | ICD-10-CM | POA: Diagnosis present

## 2019-11-26 DIAGNOSIS — Z87891 Personal history of nicotine dependence: Secondary | ICD-10-CM | POA: Insufficient documentation

## 2019-11-26 DIAGNOSIS — Z79899 Other long term (current) drug therapy: Secondary | ICD-10-CM | POA: Diagnosis not present

## 2019-11-26 DIAGNOSIS — M7918 Myalgia, other site: Secondary | ICD-10-CM

## 2019-11-26 MED ORDER — PREDNISONE 10 MG PO TABS: 40.0000 mg | ORAL_TABLET | Freq: Every day | ORAL | 0 refills | Status: AC

## 2019-11-26 MED ORDER — KETOROLAC TROMETHAMINE 15 MG/ML IJ SOLN
15.0000 mg | Freq: Once | INTRAMUSCULAR | Status: AC
  Administered 2019-11-26: 18:00:00 15 mg via INTRAMUSCULAR

## 2019-11-26 MED ORDER — METHOCARBAMOL 500 MG PO TABS: 500.0000 mg | ORAL_TABLET | Freq: Two times a day (BID) | ORAL | 0 refills | Status: DC

## 2019-11-26 MED ORDER — KETOROLAC TROMETHAMINE 15 MG/ML IJ SOLN
15.0000 mg | Freq: Once | INTRAMUSCULAR | Status: DC
  Filled 2019-11-26: qty 1

## 2019-11-26 NOTE — ED Notes (Signed)
Patient verbalizes understanding of discharge instructions. Opportunity for questioning and answers were provided. Armband removed by staff, pt discharged from ED.  
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
## 2019-11-26 NOTE — Discharge Instructions (Signed)
Thank you for allowing me to care for you today. Please return to the emergency department if you have new or worsening symptoms. Take your medications as instructed.  ° °

## 2019-12-07 DIAGNOSIS — M542 Cervicalgia: Secondary | ICD-10-CM

## 2019-12-07 HISTORY — DX: Cervicalgia: M54.2

## 2019-12-21 ENCOUNTER — Other Ambulatory Visit: Payer: Self-pay | Admitting: Family Medicine

## 2019-12-21 DIAGNOSIS — M5416 Radiculopathy, lumbar region: Secondary | ICD-10-CM

## 2020-01-07 ENCOUNTER — Ambulatory Visit
Admission: RE | Admit: 2020-01-07 | Discharge: 2020-01-07 | Disposition: A | Discharge status: Home / Self Care | Payer: 59 | Source: Ambulatory Visit | Attending: Family Medicine | Admitting: Family Medicine

## 2020-01-07 ENCOUNTER — Other Ambulatory Visit: Payer: Self-pay

## 2020-01-07 DIAGNOSIS — M5416 Radiculopathy, lumbar region: Secondary | ICD-10-CM

## 2020-01-18 ENCOUNTER — Other Ambulatory Visit: Payer: 59

## 2020-02-15 ENCOUNTER — Other Ambulatory Visit: Payer: Self-pay | Admitting: Physician Assistant

## 2020-02-15 DIAGNOSIS — M5412 Radiculopathy, cervical region: Secondary | ICD-10-CM

## 2022-04-05 ENCOUNTER — Encounter (HOSPITAL_BASED_OUTPATIENT_CLINIC_OR_DEPARTMENT_OTHER): Payer: Self-pay | Admitting: Emergency Medicine

## 2022-04-05 ENCOUNTER — Emergency Department (HOSPITAL_BASED_OUTPATIENT_CLINIC_OR_DEPARTMENT_OTHER)
Admission: EM | Admit: 2022-04-05 | Discharge: 2022-04-06 | Disposition: A | Discharge status: Home / Self Care | Payer: No Typology Code available for payment source | Attending: Emergency Medicine | Admitting: Emergency Medicine

## 2022-04-05 ENCOUNTER — Emergency Department (HOSPITAL_BASED_OUTPATIENT_CLINIC_OR_DEPARTMENT_OTHER): Payer: No Typology Code available for payment source

## 2022-04-05 ENCOUNTER — Other Ambulatory Visit: Payer: Self-pay

## 2022-04-05 DIAGNOSIS — R0789 Other chest pain: Secondary | ICD-10-CM

## 2022-04-05 DIAGNOSIS — R002 Palpitations: Secondary | ICD-10-CM | POA: Diagnosis not present

## 2022-04-05 DIAGNOSIS — R079 Chest pain, unspecified: Secondary | ICD-10-CM | POA: Diagnosis present

## 2022-04-05 LAB — BASIC METABOLIC PANEL
Anion gap: 8 (ref 5–15)
BUN: 14 mg/dL (ref 6–20)
CO2: 25 mmol/L (ref 22–32)
Calcium: 9.1 mg/dL (ref 8.9–10.3)
Chloride: 102 mmol/L (ref 98–111)
Creatinine, Ser: 1.12 mg/dL (ref 0.61–1.24)
GFR, Estimated: >60 mL/min (ref >60)
Glucose, Bld: 104 mg/dL — ABNORMAL HIGH (ref 70–99)
Potassium: 3.5 mmol/L (ref 3.5–5.1)
Sodium: 135 mmol/L (ref 135–145)

## 2022-04-05 LAB — TROPONIN I (HIGH SENSITIVITY): Troponin I (High Sensitivity): 6 ng/L (ref <18)

## 2022-04-05 LAB — CBC
HCT: 40.2 % (ref 39.0–52.0)
Hemoglobin: 12.5 g/dL — ABNORMAL LOW (ref 13.0–17.0)
MCH: 19.9 pg — ABNORMAL LOW (ref 26.0–34.0)
MCHC: 31.1 g/dL (ref 30.0–36.0)
MCV: 64 fL — ABNORMAL LOW (ref 80.0–100.0)
Platelets: 181 10*3/uL (ref 150–400)
RBC: 6.28 MIL/uL — ABNORMAL HIGH (ref 4.22–5.81)
RDW: 18.3 % — ABNORMAL HIGH (ref 11.5–15.5)
WBC: 6.6 10*3/uL (ref 4.0–10.5)
nRBC: 0 % (ref 0.0–0.2)

## 2022-04-05 NOTE — ED Triage Notes (Signed)
Pt POV reports intermittent chest pain for months. Reports it feels "like my heart beat is rapid" been going on "for a long time." Reports swelling to left axillary area, progressively worsening.   Recent trip to [place].   Pt c/o feeling ShOB x2 days.

## 2022-04-06 NOTE — ED Provider Notes (Signed)
[facility] EMERGENCY DEPARTMENT  Provider Note  CSN: [id number] Arrival date & time: 04/05/22 1902  History Chief Complaint  Patient presents with   Chest Pain   Palpitations    Kevin Arias is a 51 y.o. male with history of chest pains, L shoulder pain and SOB ongoing for at least 4 years. Previously saw Cardiology in 2019 and 2020 with normal stress echo. Has not been back since. Symptoms have not changed any during the interim. Sometimes feels like his heart is racing when he exercises. He recent took a trip to [place] but no change in symptoms since returning. He feels like the left side of his chest gets swollen sometimes and he feels bloated. He gets relief from his symptoms by taking gas medication. He is here today at the insistence of his family.    Home Medications Prior to Admission medications   Medication Sig Start Date End Date Taking? Authorizing Provider  cyclobenzaprine (FLEXERIL) 10 MG tablet Take 1 tablet (10 mg total) by mouth 2 (two) times daily as needed for muscle spasms. 11/20/19   Walisiewicz, Kaitlyn E, PA-C  fexofenadine-pseudoephedrine (ALLEGRA-D 24) 180-240 MG 24 hr tablet Take 1 tablet by mouth daily.    [provider]  fluticasone (FLONASE) 50 MCG/ACT nasal spray Place 1 spray into both nostrils daily as needed for allergies or rhinitis.    [provider]  methocarbamol (ROBAXIN) 500 MG tablet Take 1 tablet (500 mg total) by mouth 2 (two) times daily. 11/26/19   Arlyn Dunning, PA-C  Multiple Vitamin (MULTIVITAMIN WITH MINERALS) TABS tablet Take 1 tablet by mouth daily. Mens health pack of multi vitamins    [provider]     Allergies    Ciprofloxacin   Review of Systems   Review of Systems Please see HPI for pertinent positives and negatives  Physical Exam BP 121/70   Pulse (!) 58   Temp 98.6 F (37 C) (Oral)   Resp 16   Ht 6\' 2"  (1.88 m)   SpO2 100%   BMI 29.53 kg/m   Physical Exam Vitals and  nursing note reviewed.  Constitutional:      Appearance: Normal appearance.  HENT:     Head: Normocephalic and atraumatic.     Nose: Nose normal.     Mouth/Throat:     Mouth: Mucous membranes are moist.  Eyes:     Extraocular Movements: Extraocular movements intact.     Conjunctiva/sclera: Conjunctivae normal.  Cardiovascular:     Rate and Rhythm: Normal rate and regular rhythm.  Pulmonary:     Effort: Pulmonary effort is normal.     Breath sounds: Normal breath sounds. No wheezing or rales.  Chest:     Chest wall: No tenderness.  Abdominal:     General: Abdomen is flat.     Palpations: Abdomen is soft.     Tenderness: There is no abdominal tenderness. There is no guarding.  Musculoskeletal:        General: No swelling. Normal range of motion.     Cervical back: Neck supple.     Right lower leg: No edema.     Left lower leg: No edema.     Comments: Normal LUE exam, NVI, no swelling  Skin:    General: Skin is warm and dry.  Neurological:     General: No focal deficit present.     Mental Status: He is alert.  Psychiatric:        Mood and  Affect: Mood normal.     ED Results / Procedures / Treatments   EKG EKG Interpretation  Date/Time:  Monday April 05 2022 19:27:38 EDT Ventricular Rate:  60 PR Interval:  138 QRS Duration: 98 QT Interval:  456 QTC Calculation: 456 R Axis:   66 Text Interpretation: Sinus rhythm ST elev, probable normal early repol pattern No significant change since last tracing Confirmed by Susy Frizzle [phone]) on 04/05/2022 11:53:41 PM  Procedures Procedures  Medications Ordered in the ED Medications - No data to display  Initial Impression and Plan  Patient here with chronic chest pains and L shoulder pains. No change in symptoms from his baseline. His CBC, BMP, Trop are unremarkable. I personally viewed the images from radiology studies and agree with radiologist interpretation: CXR is clear. Patient reassured no signs of emergent cause of  his symptoms. He was encouraged to follow up with cardiology to re-establish care. RTED if his symptoms change or if he has any other concerns.   ED Course       MDM Rules/Calculators/A&P Medical Decision Making Given presenting complaint, I considered that admission might be necessary. After review of results from ED lab and/or imaging studies, admission to the hospital is not indicated at this time.    Problems Addressed: Atypical chest pain: chronic illness or injury  Amount and/or Complexity of Data Reviewed Labs: ordered. Decision-making details documented in ED Course. Radiology: ordered and independent interpretation performed. Decision-making details documented in ED Course. ECG/medicine tests: ordered and independent interpretation performed. Decision-making details documented in ED Course.  Risk Decision regarding hospitalization.    Final Clinical Impression(s) / ED Diagnoses Final diagnoses:  Atypical chest pain    Rx / DC Orders ED Discharge Orders          Ordered    Ambulatory referral to Cardiology       Comments: If you have not heard from the Cardiology office within the next 72 hours please call [phone].   04/06/22 0005             Pollyann Savoy, MD 04/06/22 0006

## 2022-04-06 NOTE — ED Provider Notes (Incomplete)
[facility] EMERGENCY DEPARTMENT  Provider Note  CSN: [id number] Arrival date & time: 04/05/22 1902  History Chief Complaint  Patient presents with  . Chest Pain  . Palpitations    Kevin Arias is a 51 y.o. male with history of chest pains, L shoulder pain and SOB ongoing for at least 4 years. Previously saw Cardiology in 2019 and 2020 with normal stress echo. Has not been back since. Symptoms have not changed any during the interim. Sometimes feels like his heart is racing when he exercises. He recent took a trip to [place] but no change in symptoms since returning. He feels like the left side of his chest gets swollen sometimes and he feels bloated. He gets relief from his symptoms by taking gas medication. He is here today at the insistence of his family.    Home Medications Prior to Admission medications   Medication Sig Start Date End Date Taking? Authorizing Provider  cyclobenzaprine (FLEXERIL) 10 MG tablet Take 1 tablet (10 mg total) by mouth 2 (two) times daily as needed for muscle spasms. 11/20/19   Walisiewicz, Kaitlyn E, PA-C  fexofenadine-pseudoephedrine (ALLEGRA-D 24) 180-240 MG 24 hr tablet Take 1 tablet by mouth daily.    [provider]  fluticasone (FLONASE) 50 MCG/ACT nasal spray Place 1 spray into both nostrils daily as needed for allergies or rhinitis.    [provider]  methocarbamol (ROBAXIN) 500 MG tablet Take 1 tablet (500 mg total) by mouth 2 (two) times daily. 11/26/19   Arlyn Dunning, PA-C  Multiple Vitamin (MULTIVITAMIN WITH MINERALS) TABS tablet Take 1 tablet by mouth daily. Mens health pack of multi vitamins    [provider]     Allergies    Ciprofloxacin   Review of Systems   Review of Systems Please see HPI for pertinent positives and negatives  Physical Exam BP 121/70   Pulse (!) 58   Temp 98.6 F (37 C) (Oral)   Resp 16   Ht 6\' 2"  (1.88 m)   SpO2 100%   BMI 29.53 kg/m   Physical Exam Vitals and  nursing note reviewed.  Constitutional:      Appearance: Normal appearance.  HENT:     Head: Normocephalic and atraumatic.     Nose: Nose normal.     Mouth/Throat:     Mouth: Mucous membranes are moist.  Eyes:     Extraocular Movements: Extraocular movements intact.     Conjunctiva/sclera: Conjunctivae normal.  Cardiovascular:     Rate and Rhythm: Normal rate and regular rhythm.  Pulmonary:     Effort: Pulmonary effort is normal.     Breath sounds: Normal breath sounds. No wheezing or rales.  Chest:     Chest wall: No tenderness.  Abdominal:     General: Abdomen is flat.     Palpations: Abdomen is soft.     Tenderness: There is no abdominal tenderness. There is no guarding.  Musculoskeletal:        General: No swelling. Normal range of motion.     Cervical back: Neck supple.     Right lower leg: No edema.     Left lower leg: No edema.     Comments: Normal LUE exam, NVI, no swelling  Skin:    General: Skin is warm and dry.  Neurological:     General: No focal deficit present.     Mental Status: He is alert.  Psychiatric:        Mood and  Affect: Mood normal.     ED Results / Procedures / Treatments   EKG EKG Interpretation  Date/Time:  Monday April 05 2022 19:27:38 EDT Ventricular Rate:  60 PR Interval:  138 QRS Duration: 98 QT Interval:  456 QTC Calculation: 456 R Axis:   66 Text Interpretation: Sinus rhythm ST elev, probable normal early repol pattern No significant change since last tracing Confirmed by Susy Frizzle [phone]) on 04/05/2022 11:53:41 PM  Procedures Procedures  Medications Ordered in the ED Medications - No data to display  Initial Impression and Plan  Patient here with chronic chest pains and L shoulder pains. No change in symptoms from his baseline. His CBC, BMP, Trop are unremarkable. I personally viewed the images from radiology studies and agree with radiologist interpretation: CXR is clear. Patient reassured no signs of emergent cause of  his symptoms. He was encouraged to follow up with cardiology to re-establish care. RTED if his symptoms change or if he has any other concerns.   ED Course       MDM Rules/Calculators/A&P Medical Decision Making Amount and/or Complexity of Data Reviewed Labs: ordered. Radiology: ordered.    Final Clinical Impression(s) / ED Diagnoses Final diagnoses:  None    Rx / DC Orders ED Discharge Orders     None

## 2022-06-15 DIAGNOSIS — D649 Anemia, unspecified: Secondary | ICD-10-CM | POA: Insufficient documentation

## 2022-06-15 DIAGNOSIS — R519 Headache, unspecified: Secondary | ICD-10-CM | POA: Insufficient documentation

## 2022-06-15 DIAGNOSIS — J309 Allergic rhinitis, unspecified: Secondary | ICD-10-CM | POA: Insufficient documentation

## 2022-06-15 DIAGNOSIS — F17200 Nicotine dependence, unspecified, uncomplicated: Secondary | ICD-10-CM

## 2022-06-15 DIAGNOSIS — Z7189 Other specified counseling: Secondary | ICD-10-CM | POA: Insufficient documentation

## 2022-06-15 DIAGNOSIS — J45909 Unspecified asthma, uncomplicated: Secondary | ICD-10-CM | POA: Insufficient documentation

## 2022-06-15 DIAGNOSIS — S86819A Strain of other muscle(s) and tendon(s) at lower leg level, unspecified leg, initial encounter: Secondary | ICD-10-CM | POA: Insufficient documentation

## 2022-06-15 DIAGNOSIS — F528 Other sexual dysfunction not due to a substance or known physiological condition: Secondary | ICD-10-CM

## 2022-06-15 DIAGNOSIS — R079 Chest pain, unspecified: Secondary | ICD-10-CM | POA: Insufficient documentation

## 2022-06-15 DIAGNOSIS — G4730 Sleep apnea, unspecified: Secondary | ICD-10-CM | POA: Insufficient documentation

## 2022-06-15 DIAGNOSIS — M545 Low back pain, unspecified: Secondary | ICD-10-CM | POA: Insufficient documentation

## 2022-06-15 DIAGNOSIS — N411 Chronic prostatitis: Secondary | ICD-10-CM

## 2022-06-15 DIAGNOSIS — F4310 Post-traumatic stress disorder, unspecified: Secondary | ICD-10-CM | POA: Insufficient documentation

## 2022-06-15 HISTORY — DX: Headache, unspecified: R51.9

## 2022-06-15 HISTORY — DX: Nicotine dependence, unspecified, uncomplicated: F17.200

## 2022-06-15 HISTORY — DX: Low back pain, unspecified: M54.50

## 2022-06-15 HISTORY — DX: Other sexual dysfunction not due to a substance or known physiological condition: F52.8

## 2022-06-15 HISTORY — DX: Chronic prostatitis: N41.1

## 2022-06-15 HISTORY — DX: Anemia, unspecified: D64.9

## 2022-06-15 HISTORY — DX: Other specified counseling: Z71.89

## 2022-06-16 ENCOUNTER — Encounter: Payer: Self-pay | Admitting: Cardiology

## 2022-06-16 ENCOUNTER — Ambulatory Visit: Payer: No Typology Code available for payment source | Attending: Cardiology | Admitting: Cardiology

## 2022-06-16 VITALS — BP 118/66 | HR 52 | Ht 74.0 in | Wt 226.0 lb

## 2022-06-16 DIAGNOSIS — R0789 Other chest pain: Secondary | ICD-10-CM | POA: Diagnosis not present

## 2022-06-16 DIAGNOSIS — E782 Mixed hyperlipidemia: Secondary | ICD-10-CM

## 2022-06-16 HISTORY — DX: Mixed hyperlipidemia: E78.2
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
## 2022-06-16 NOTE — Patient Instructions (Addendum)
Medication Instructions:  Your physician recommends that you continue on your current medications as directed. Please refer to the Current Medication list given to you today.  *If you need a refill on your cardiac medications before your next appointment, please call your pharmacy*   Lab Work: None ordered If you have labs (blood work) drawn today and your tests are completely normal, you will receive your results only by: [place] (if you have MyChart) OR A paper copy in the mail If you have any lab test that is abnormal or we need to change your treatment, we will call you to review the results.   Testing/Procedures: Your physician has requested that you have an exercise tolerance test. For further information please visit [email or web address]. Please also follow instruction sheet, as given.  Please arrive 15 minutes prior to your appointment time for registration and insurance purposes.  The test will take approximately 45 minutes to complete.  How to prepare for your Exercise Stress Test: Do bring a list of your current medications with you.  If not listed below, you may take your medications as normal. Do wear comfortable clothes (no dresses or overalls) and walking shoes, tennis shoes preferred (no heels or open toed shoes are allowed) Do Not wear cologne, perfume, aftershave or lotions (deodorant is allowed). Please report to [place], [address] for your test.  If these instructions are not followed, your test will have to be rescheduled.  If you have questions or concerns about your appointment, you can call the Stress Lab at [phone].  If you cannot keep your appointment, please provide 24 hours notification to the Stress Lab, to avoid a possible $50 charge to your account.   Follow-Up: At [facility], you and your health needs are our priority.  As part of our continuing mission to provide you with exceptional heart care, we have created  designated Provider Care Teams.  These Care Teams include your primary Cardiologist (physician) and Advanced Practice Providers (APPs -  Physician Assistants and Nurse Practitioners) who all work together to provide you with the care you need, when you need it.  We recommend signing up for the patient portal called "MyChart".  Sign up information is provided on this After Visit Summary.  MyChart is used to connect with patients for Virtual Visits (Telemedicine).  Patients are able to view lab/test results, encounter notes, upcoming appointments, etc.  Non-urgent messages can be sent to your provider as well.   To learn more about what you can do with MyChart, go to [email or web address].    Your next appointment:   6 month(s)  The format for your next appointment:   In Person  Provider:   Jyl Heinz, MD   Other Instructions NA

## 2022-06-28 ENCOUNTER — Telehealth (HOSPITAL_COMMUNITY): Payer: Self-pay | Admitting: *Deleted

## 2022-06-28 NOTE — Telephone Encounter (Signed)
Instructions for upcoming stress test on 10/30//23 at 3:30 discussed with patient.

## 2022-07-05 ENCOUNTER — Encounter (HOSPITAL_COMMUNITY): Payer: No Typology Code available for payment source

## 2022-07-05 ENCOUNTER — Telehealth (HOSPITAL_COMMUNITY): Payer: Self-pay

## 2022-07-05 NOTE — Telephone Encounter (Signed)
Error by Perrin Maltese

## 2022-07-06 ENCOUNTER — Ambulatory Visit (HOSPITAL_COMMUNITY): Payer: No Typology Code available for payment source | Attending: Cardiology

## 2022-07-06 DIAGNOSIS — R0789 Other chest pain: Secondary | ICD-10-CM | POA: Diagnosis present

## 2022-07-07 ENCOUNTER — Telehealth: Payer: Self-pay

## 2022-07-07 LAB — EXERCISE TOLERANCE TEST
Angina Index: 0
Base ST Depression (mm): 0 mm
Duke Treadmill Score: 10
Estimated workload: 11.7
Exercise duration (min): 10 min
Exercise duration (sec): 0 s
MPHR: 167 {beats}/min
Peak HR: 162 {beats}/min
Percent HR: 97 %
Rest HR: 53 {beats}/min
ST Depression (mm): 0 mm

## 2022-07-07 NOTE — Telephone Encounter (Signed)
Patient notified via mychart

## 2022-07-07 NOTE — Telephone Encounter (Signed)
-----   Message from Rajan R Revankar, MD sent at 07/07/2022  8:12 AM EDT ----- The results of the study is unremarkable. Please inform patient. I will discuss in detail at next appointment. Cc  primary care/referring physician Rajan R Revankar, MD 07/07/2022 8:12 AM  

## 2022-11-10 LAB — LAB REPORT - SCANNED
A1c: 6.4
EGFR: 73

## 2023-05-05 ENCOUNTER — Other Ambulatory Visit: Payer: Self-pay

## 2023-05-05 DIAGNOSIS — K582 Mixed irritable bowel syndrome: Secondary | ICD-10-CM

## 2023-05-05 DIAGNOSIS — F5221 Male erectile disorder: Secondary | ICD-10-CM | POA: Insufficient documentation

## 2023-05-05 DIAGNOSIS — F331 Major depressive disorder, recurrent, moderate: Secondary | ICD-10-CM

## 2023-05-05 DIAGNOSIS — F32A Depression, unspecified: Secondary | ICD-10-CM | POA: Insufficient documentation

## 2023-05-05 DIAGNOSIS — G4709 Other insomnia: Secondary | ICD-10-CM

## 2023-05-05 DIAGNOSIS — G8929 Other chronic pain: Secondary | ICD-10-CM

## 2023-05-05 DIAGNOSIS — N529 Male erectile dysfunction, unspecified: Secondary | ICD-10-CM | POA: Insufficient documentation

## 2023-05-05 HISTORY — DX: Mixed irritable bowel syndrome: K58.2

## 2023-05-05 HISTORY — DX: Other chronic pain: G89.29

## 2023-05-05 HISTORY — DX: Major depressive disorder, recurrent, moderate: F33.1

## 2023-05-05 HISTORY — DX: Male erectile dysfunction, unspecified: N52.9

## 2023-05-05 HISTORY — DX: Male erectile disorder: F52.21

## 2023-05-05 HISTORY — DX: Depression, unspecified: F32.A

## 2023-05-05 HISTORY — DX: Other insomnia: G47.09

## 2023-05-06 ENCOUNTER — Ambulatory Visit: Payer: Self-pay | Admitting: Cardiology

## 2023-06-01 ENCOUNTER — Ambulatory Visit: Payer: Self-pay | Admitting: Cardiology

## 2023-06-27 ENCOUNTER — Encounter: Payer: Self-pay | Admitting: Cardiology

## 2023-06-27 ENCOUNTER — Ambulatory Visit: Payer: Managed Care, Other (non HMO) | Attending: Cardiology | Admitting: Cardiology

## 2023-06-27 VITALS — BP 126/64 | HR 58 | Ht 74.0 in | Wt 234.1 lb

## 2023-06-27 DIAGNOSIS — R079 Chest pain, unspecified: Secondary | ICD-10-CM | POA: Insufficient documentation

## 2023-06-27 DIAGNOSIS — R0789 Other chest pain: Secondary | ICD-10-CM

## 2023-06-27 DIAGNOSIS — E782 Mixed hyperlipidemia: Secondary | ICD-10-CM | POA: Diagnosis not present

## 2023-06-27 DIAGNOSIS — E66811 Obesity, class 1: Secondary | ICD-10-CM | POA: Insufficient documentation

## 2023-06-27 NOTE — Patient Instructions (Addendum)
Medication Instructions:  Your physician recommends that you continue on your current medications as directed. Please refer to the Current Medication list given to you today.  *If you need a refill on your cardiac medications before your next appointment, please call your pharmacy*   Lab Work: None ordered If you have labs (blood work) drawn today and your tests are completely normal, you will receive your results only by: MyChart Message (if you have MyChart) OR A paper copy in the mail If you have any lab test that is abnormal or we need to change your treatment, we will call you to review the results.   Testing/Procedures:  Stress Echocardiogram Instructions:    1. You may take all of your medications.  2. No food, drink or tobacco products four hours prior to your test.  3. Dress prepared to exercise. Best to wear 2 piece outfit and tennis shoes. Shoes must be closed toe.  4. Please bring all current prescription medications.  We will order CT coronary calcium score. It will cost $99.00 and iis due at time of scan.  Please call to schedule.    [facility] [address] [place], [place] [address] [phone] Located on the first floor in the imaging department.  Follow-Up: At [facility], you and your health needs are our priority.  As part of our continuing mission to provide you with exceptional heart care, we have created designated Provider Care Teams.  These Care Teams include your primary Cardiologist (physician) and Advanced Practice Providers (APPs -  Physician Assistants and Nurse Practitioners) who all work together to provide you with the care you need, when you need it.  We recommend signing up for the patient portal called "MyChart".  Sign up information is provided on this After Visit Summary.  MyChart is used to connect with patients for Virtual Visits (Telemedicine).  Patients are able to view lab/test results, encounter notes, upcoming  appointments, etc.  Non-urgent messages can be sent to your provider as well.   To learn more about what you can do with MyChart, go to [email or web address].    Your next appointment:   12 month(s)  The format for your next appointment:   In Person  Provider:   Belva Crome, MD   Other Instructions Exercise Stress Echocardiogram An exercise stress echocardiogram is a test to check how well your heart is working. This test uses sound waves and a computer to make pictures of your heart. These pictures will be taken before and after you exercise. For this test, you will walk on a treadmill or ride a bicycle to make your heart beat faster. While you exercise, your heart will be checked with an electrocardiogram (ECG). Your blood pressure will also be checked. You may have this test if: You have chest pain or a heart problem. You had a heart attack or heart surgery not long ago. You have heart valve problems. You have a condition that causes narrowing of the blood vessels that supply your heart. You have a high risk of heart disease and: You are starting a new exercise program. You need to have a big surgery. Tell a doctor about: Any allergies you have. All medicines you are taking. This includes vitamins, herbs, eye drops, creams, and over-the-counter medicines. Any problems you or family members have had with medicines that make you fall asleep (anesthetic medicines). Any surgeries you have had. Any blood disorders you have. Any medical conditions you have. Whether you are  pregnant or may be pregnant. What are the risks? Generally, this is a safe test. However, problems may occur, including: Chest pain. Feeling dizzy or light-headed. Shortness of breath. Increased or irregular heartbeat. Feeling like you may vomit (nausea) or vomiting. Heart attack. This is very rare. What happens before the test? Medicines Ask your doctor about changing or stopping your normal medicines.  This is important if you take diabetes medicines or blood thinners. If you use an inhaler, bring it to the test. General instructions Wear comfortable clothes and walking shoes. Follow instructions from your doctor about what you cannot eat or drink before the test. Do not drink or eat anything that has caffeine in it. Stop having caffeine 24 hours before the test. Do not smoke or use products that contain nicotine or tobacco for 4 hours before the test. If you need help quitting, ask your doctor. What happens during the test?  You will take off your clothes from the waist up and put on a hospital gown. Electrodes or patches will be put on your chest. A blood pressure cuff will be put on your arm. Before you exercise, a computer will make a picture of your heart. To do this: You will lie down and a gel will be put on your chest. A wand will be moved over the gel. Sound waves from the wand will go to the computer to make the picture. Then, you will start to exercise. You may walk on a treadmill or pedal a bicycle. Your blood pressure and heart rhythm will be checked while you exercise. The exercise will get harder or faster. You will exercise until: Your heart reaches a certain level. You are too tired to go on. You cannot go on because of chest pain, weakness, or dizziness. You will lie down right away so another picture of your heart can be taken. The procedure may vary among doctors and hospitals. What can I expect after the test? After your test, it is common to have: Mild soreness. Mild tiredness. Your heart rate and blood pressure will be checked until they return to your normal levels. You should not have any new symptoms after this test. Follow these instructions at home: If your doctor says that you can, you may: Eat what you normally eat. Do your normal activities. Take over-the-counter and prescription medicines only as told by your doctor. Keep all follow-up visits. It  is up to you to get the results of your test. Ask how to get your results when they are ready. Contact a doctor if: You feel dizzy or light-headed. You have a fast or irregular heartbeat. You feel like you may vomit or you vomit. You have a headache. You feel short of breath. Get help right away if: You develop pain or pressure: In your chest. In your jaw or neck. Between your shoulders. That goes down your left arm. You faint. You have trouble breathing. These symptoms may be an emergency. Get medical help right away. Call your local emergency services (911 in the [place] Do not wait to see if the symptoms will go away. Do not drive yourself to the hospital. Summary This is a test that checks how well your heart is working. Follow instructions about what you cannot eat or drink before the test. Ask your doctor if you should take your normal medicines before the test. Stop having caffeine 24 hours before the test. Do not smoke or use products with nicotine or tobacco in them for 4  hours before the test. During the test, your blood pressure and heart rhythm will be checked while you exercise. This information is not intended to replace advice given to you by your health care provider. Make sure you discuss any questions you have with your health care provider. Document Revised: 05/06/2021 Document Reviewed: 04/15/2020 Elsevier Patient Education  2022 Elsevier Inc.  Coronary Calcium Scan A coronary calcium scan is an imaging test used to look for deposits of plaque in the inner lining of the blood vessels of the heart (coronary arteries). Plaque is made up of calcium, protein, and fatty substances. These deposits of plaque can partly clog and narrow the coronary arteries without producing any symptoms or warning signs. This puts a person at risk for a heart attack. A coronary calcium scan is performed using a computed tomography (CT) scanner machine without using a dye (contrast). This  test is recommended for people who are at moderate risk for heart disease. The test can find plaque deposits before symptoms develop. Tell a health care provider about: Any allergies you have. All medicines you are taking, including vitamins, herbs, eye drops, creams, and over-the-counter medicines. Any problems you or family members have had with anesthetic medicines. Any bleeding problems you have. Any surgeries you have had. Any medical conditions you have. Whether you are pregnant or may be pregnant. What are the risks? Generally, this is a safe procedure. However, problems may occur, including: Harm to a pregnant woman and her unborn baby. This test involves the use of radiation. Radiation exposure can be dangerous to a pregnant woman and her unborn baby. If you are pregnant or think you may be pregnant, you should not have this procedure done. A slight increase in the risk of cancer. This is because of the radiation involved in the test. The amount of radiation from one test is similar to the amount of radiation you are naturally exposed to over one year. What happens before the procedure? Ask your health care provider for any specific instructions on how to prepare for this procedure. You may be asked to avoid products that contain caffeine, tobacco, or nicotine for 4 hours before the procedure. What happens during the procedure?  You will undress and remove any jewelry from your neck or chest. You may need to remove hearing aides and dentures. Women may need to remove their bras. You will put on a hospital gown. Sticky electrodes will be placed on your chest. The electrodes will be connected to an electrocardiogram (ECG) machine to record a tracing of the electrical activity of your heart. You will lie down on your back on a curved bed that is attached to the CT scanner. You may be given medicine to slow down your heart rate so that clear pictures can be created. You will be moved into  the CT scanner, and the CT scanner will take pictures of your heart. During this time, you will be asked to lie still and hold your breath for 10-20 seconds at a time while each picture of your heart is being taken. The procedure may vary among health care providers and hospitals. What can I expect after the procedure? You can return to your normal activities. It is up to you to get the results of your procedure. Ask your health care provider, or the department that is doing the procedure, when your results will be ready. Summary A coronary calcium scan is an imaging test used to look for deposits of plaque in the  inner lining of the blood vessels of the heart. Plaque is made up of calcium, protein, and fatty substances. A coronary calcium scan is performed using a CT scanner machine without contrast. Generally, this is a safe procedure. Tell your health care provider if you are pregnant or may be pregnant. Ask your health care provider for any specific instructions on how to prepare for this procedure. You can return to your normal activities after the scan is done. This information is not intended to replace advice given to you by your health care provider. Make sure you discuss any questions you have with your health care provider. Document Revised: 08/02/2021 Document Reviewed: 08/02/2021 Elsevier Patient Education  2024 [employer].

## 2023-06-27 NOTE — Progress Notes (Signed)
Cardiology Office Note:    Date:  06/27/2023   ID:  Kevin Arias, DOB Mar 12, 1971, MRN [id number]  PCP:  Noberto Retort, MD  Cardiologist:  Garwin Brothers, MD   Referring MD: Noberto Retort, MD    ASSESSMENT:    1. Atypical chest pain   2. Mixed dyslipidemia   3. Obesity (BMI 30.0-34.9)    PLAN:    In order of problems listed above:  Primary prevention stressed with the patient.  Importance of compliance with diet medication stressed and patient verbalized standing. Chest pain: Gives history of chest pain which is atypical.  This is in his substernal area.  No orthopnea or PND.  However he wants to up his exercise program so we will do a exercise stress echo. Obesity: Weight reduction stressed and diet was emphasized and he promises to do better. Elevated cholesterol and hemoglobin A1c: Lifestyle modification urged.  I cautioned him about getting better with his diet and exercise.  He will start an exercise program after the results of the stress echo will be discussed with him.  He had multiple questions which were answered to satisfaction. He is interested in calcium scoring for risk stratification and we will set this up for him. Patient will be seen in follow-up appointment in 12 months or earlier if the patient has any concerns.    Medication Adjustments/Labs and Tests Ordered: Current medicines are reviewed at length with the patient today.  Concerns regarding medicines are outlined above.  Orders Placed This Encounter  Procedures   CT CARDIAC SCORING   EKG 12-Lead   ECHOCARDIOGRAM STRESS TEST   No orders of the defined types were placed in this encounter.    No chief complaint on file.    History of Present Illness:    Kevin Arias is a 52 y.o. male.  Patient is here for follow-up.  He denies any history of hypertension dyslipidemia or diabetes mellitus.  However his lipid numbers and hemoglobin A1c is mildly elevated.  His primary care has alerted that  about this.  He also is overweight.  At the time of my evaluation, the patient is alert awake oriented and in no distress.  Past Medical History:  Diagnosis Date   Allergic rhinitis    Anemia 06/15/2022   May 27, 2010 Entered By: Bennie Pierini B Comment: beta THALASSEMIA MINOR   Asthma    Atypical chest pain 02/24/2018   Avulsion of left patellar tendon 12/26/2014   Chest pain    "pinching type pain" - occasional L arm discomfort   Chronic pain 05/05/2023   Chronic prostatitis 06/15/2022   Depression, unspecified 05/05/2023   Gastroesophageal reflux disease without esophagitis 02/24/2018   Headache 06/15/2022   Initial insomnia 05/05/2023   Low back pain 06/15/2022   Major depressive disorder, recurrent, moderate (HCC) 05/05/2023   Male erectile disorder (CODE) 05/05/2023   Male erectile dysfunction, unspecified 05/05/2023   Mixed dyslipidemia 06/16/2022   Mixed irritable bowel syndrome 05/05/2023   Moderate recurrent major depression (HCC) 05/05/2023   Other specified counseling 06/15/2022   Pain of cervical spine 12/07/2019   Patellar tendon rupture    Psychosexual dysfunction with inhibited sexual excitement 06/15/2022   PTSD (post-traumatic stress disorder)    Sleep apnea    does not use his c pap ( use to use c pap)   Tobacco use disorder 06/15/2022    Past Surgical History:  Procedure Laterality Date   PATELLAR TENDON REPAIR Left 12/26/2014   Procedure: PATELLA  TENDON REPAIR ON THE LEFT;  Surgeon: Jene Every, MD;  Location: [facility];  Service: Orthopedics;  Laterality: Left;   UPPER GI ENDOSCOPY      Current Medications: Current Meds  Medication Sig   fexofenadine-pseudoephedrine (ALLEGRA-D 24) 180-240 MG 24 hr tablet Take 1 tablet by mouth daily.   fluticasone (FLONASE) 50 MCG/ACT nasal spray Place 1 spray into both nostrils daily as needed for allergies or rhinitis.   Multiple Vitamin (MULTIVITAMIN WITH MINERALS) TABS tablet Take 1 tablet by mouth daily. Mens  health pack of multi vitamins     Allergies:   Ciprofloxacin   Social History   Socioeconomic History   Marital status: Married    Spouse name: Not on file   Number of children: Not on file   Years of education: Not on file   Highest education level: Not on file  Occupational History   Not on file  Tobacco Use   Smoking status: Former    Current packs/day: 0.00    Types: Cigarettes    Quit date: 12/24/2004    Years since quitting: 18.5   Smokeless tobacco: Never  Vaping Use   Vaping status: Never Used  Substance and Sexual Activity   Alcohol use: Yes    Comment: occ   Drug use: No   Sexual activity: Not on file  Other Topics Concern   Not on file  Social History Narrative   Not on file   Social Determinants of Health   Financial Resource Strain: Not on file  Food Insecurity: Not on file  Transportation Needs: Not on file  Physical Activity: Not on file  Stress: Not on file  Social Connections: Not on file     Family History: The patient's family history includes Aneurysm in his sister; Anuerysm in his sister; Cancer in his mother; Diabetes in his sister; Hypertension in his mother and sister.  ROS:   Please see the history of present illness.    All other systems reviewed and are negative.  EKGs/Labs/Other Studies Reviewed:    The following studies were reviewed today: .Marland KitchenEKG Interpretation Date/Time:  Monday June 27 2023 11:18:35 EDT Ventricular Rate:  58 PR Interval:  140 QRS Duration:  92 QT Interval:  458 QTC Calculation: 449 R Axis:   80  Text Interpretation: Sinus bradycardia When compared with ECG of 05-Apr-2022 19:27, PREVIOUS ECG IS PRESENT Confirmed by Belva Crome [phone]) on 06/27/2023 11:47:38 AM     Recent Labs: No results found for requested labs within last 365 days.  Recent Lipid Panel    Component Value Date/Time   CHOL 178 03/14/2019 1513   TRIG 134 03/14/2019 1513   HDL 62 03/14/2019 1513   CHOLHDL 2.9 03/14/2019 1513    LDLCALC 89 03/14/2019 1513    Physical Exam:    VS:  BP 126/64   Pulse (!) 58   Ht 6\' 2"  (1.88 m)   Wt 234 lb 1.3 oz (106.2 kg)   SpO2 97%   BMI 30.05 kg/m     Wt Readings from Last 3 Encounters:  06/27/23 234 lb 1.3 oz (106.2 kg)  06/16/22 226 lb 0.6 oz (102.5 kg)  11/26/19 230 lb (104.3 kg)     GEN: Patient is in no acute distress HEENT: Normal NECK: No JVD; No carotid bruits LYMPHATICS: No lymphadenopathy CARDIAC: Hear sounds regular, 2/6 systolic murmur at the apex. RESPIRATORY:  Clear to auscultation without rales, wheezing or rhonchi  ABDOMEN: Soft, non-tender, non-distended MUSCULOSKELETAL:  No edema; No  deformity  SKIN: Warm and dry NEUROLOGIC:  Alert and oriented x 3 PSYCHIATRIC:  Normal affect   Signed, Garwin Brothers, MD  06/27/2023 11:50 AM     [facility]

## 2023-06-28 ENCOUNTER — Ambulatory Visit (HOSPITAL_BASED_OUTPATIENT_CLINIC_OR_DEPARTMENT_OTHER)
Admission: RE | Admit: 2023-06-28 | Discharge: 2023-06-28 | Disposition: A | Discharge status: Home / Self Care | Payer: Managed Care, Other (non HMO) | Source: Ambulatory Visit | Attending: Cardiology | Admitting: Cardiology

## 2023-06-28 DIAGNOSIS — E782 Mixed hyperlipidemia: Secondary | ICD-10-CM | POA: Insufficient documentation

## 2023-07-28 ENCOUNTER — Telehealth: Payer: Self-pay

## 2023-07-28 DIAGNOSIS — E782 Mixed hyperlipidemia: Secondary | ICD-10-CM

## 2023-07-28 NOTE — Telephone Encounter (Signed)
-----   Message from Aundra Dubin Revankar sent at 07/26/2023  4:47 PM EST ----- Calcium score is 0.  He has aortic atherosclerosis.  He needs to come in for a Chem-7 liver lipid check.  Will need to start statin therapy.  Also he needs to talk to primary care about hepatic cysts and gets ultrasound for evaluation.  Please send copy to primary care Garwin Brothers, MD 07/26/2023 4:46 PM

## 2023-07-28 NOTE — Telephone Encounter (Signed)
Mychart message

## 2023-08-01 ENCOUNTER — Other Ambulatory Visit (HOSPITAL_COMMUNITY): Payer: Managed Care, Other (non HMO)

## 2023-08-08 ENCOUNTER — Other Ambulatory Visit (HOSPITAL_COMMUNITY): Payer: Managed Care, Other (non HMO)

## 2023-08-13 LAB — COMPREHENSIVE METABOLIC PANEL
ALT: 23 [IU]/L (ref 0–44)
AST: 19 [IU]/L (ref 0–40)
Albumin: 4.7 g/dL (ref 3.8–4.9)
Alkaline Phosphatase: 78 [IU]/L (ref 44–121)
BUN/Creatinine Ratio: 11 (ref 9–20)
BUN: 12 mg/dL (ref 6–24)
Bilirubin Total: 1.3 mg/dL — ABNORMAL HIGH (ref 0.0–1.2)
CO2: 24 mmol/L (ref 20–29)
Calcium: 10 mg/dL (ref 8.7–10.2)
Chloride: 102 mmol/L (ref 96–106)
Creatinine, Ser: 1.07 mg/dL (ref 0.76–1.27)
Globulin, Total: 2.2 g/dL (ref 1.5–4.5)
Glucose: 97 mg/dL (ref 70–99)
Potassium: 4.3 mmol/L (ref 3.5–5.2)
Sodium: 142 mmol/L (ref 134–144)
Total Protein: 6.9 g/dL (ref 6.0–8.5)
eGFR: 82 mL/min/{1.73_m2} (ref >59)

## 2023-08-13 LAB — LIPID PANEL
Chol/HDL Ratio: 3.5 {ratio} (ref 0.0–5.0)
Cholesterol, Total: 200 mg/dL — ABNORMAL HIGH (ref 100–199)
HDL: 57 mg/dL (ref >39)
LDL Chol Calc (NIH): 113 mg/dL — ABNORMAL HIGH (ref 0–99)
Triglycerides: 172 mg/dL — ABNORMAL HIGH (ref 0–149)
VLDL Cholesterol Cal: 30 mg/dL (ref 5–40)

## 2023-08-15 ENCOUNTER — Telehealth: Payer: Self-pay

## 2023-08-15 DIAGNOSIS — E782 Mixed hyperlipidemia: Secondary | ICD-10-CM

## 2023-08-15 MED ORDER — ROSUVASTATIN CALCIUM 10 MG PO TABS: 10.0000 mg | ORAL_TABLET | Freq: Every day | ORAL | 3 refills | Status: DC

## 2023-08-15 NOTE — Telephone Encounter (Signed)
myChart message 12/9

## 2023-08-15 NOTE — Telephone Encounter (Signed)
-----   Message from Aundra Dubin Revankar sent at 08/15/2023 12:14 PM EST ----- Mild atherosclerosis. Rosuva 10 daily and diet and exercise and ll 6wls.. cc pcp Garwin Brothers, MD 08/15/2023 12:14 PM

## 2023-08-18 ENCOUNTER — Telehealth: Payer: Self-pay | Admitting: Cardiology

## 2023-08-18 ENCOUNTER — Encounter: Payer: Self-pay | Admitting: Cardiology

## 2023-08-18 NOTE — Telephone Encounter (Signed)
Patient said they just went by the [facility] in [place] and they didn't receive the RX for the Rosuvastatin. Could it be sent in again? CB # [phone]

## 2023-08-18 NOTE — Telephone Encounter (Signed)
Called [facility] in [place] and they verified they had received the prescription for the patient's Rosuvastatin.  The problem was the insurance as either the pharmacy or the insurance company does not have the correct date of birth as they are not matching up.  The pharmacist stated they would contact the insurance company and attempt to get this straightened out.  I called and spoke with the patient and informed of this and he lets me know that he actually has a different insurance card now.  I informed him that he needed to let the pharmacy know and that may help fix the problem, he thanked me for the call and had not additional questions.

## 2023-09-02 ENCOUNTER — Telehealth (HOSPITAL_COMMUNITY): Payer: Self-pay | Admitting: *Deleted

## 2023-09-02 ENCOUNTER — Other Ambulatory Visit (HOSPITAL_COMMUNITY): Payer: Managed Care, Other (non HMO)

## 2023-09-02 NOTE — Telephone Encounter (Signed)
 Pt given instructions for stress echo.

## 2023-09-05 ENCOUNTER — Other Ambulatory Visit (HOSPITAL_COMMUNITY): Payer: Managed Care, Other (non HMO)

## 2023-09-08 ENCOUNTER — Ambulatory Visit (HOSPITAL_COMMUNITY): Payer: Managed Care, Other (non HMO)

## 2023-09-08 ENCOUNTER — Ambulatory Visit (HOSPITAL_COMMUNITY)
Admission: RE | Admit: 2023-09-08 | Discharge: 2023-09-08 | Disposition: A | Discharge status: Home / Self Care | Payer: Managed Care, Other (non HMO) | Source: Ambulatory Visit | Attending: Cardiology | Admitting: Cardiology

## 2023-09-08 ENCOUNTER — Other Ambulatory Visit (HOSPITAL_COMMUNITY): Payer: Managed Care, Other (non HMO)

## 2023-09-08 DIAGNOSIS — R0789 Other chest pain: Secondary | ICD-10-CM | POA: Diagnosis present

## 2023-09-08 MED ORDER — PERFLUTREN LIPID MICROSPHERE
6.0000 mL | INTRAVENOUS | Status: AC | PRN
  Administered 2023-09-08: 6 mL via INTRAVENOUS

## 2024-02-10 ENCOUNTER — Other Ambulatory Visit: Payer: Self-pay

## 2024-02-10 DIAGNOSIS — E782 Mixed hyperlipidemia: Secondary | ICD-10-CM

## 2024-02-10 MED ORDER — ROSUVASTATIN CALCIUM 10 MG PO TABS: 10.0000 mg | ORAL_TABLET | Freq: Every day | ORAL | 1 refills | Status: AC

## 2024-02-10 NOTE — Telephone Encounter (Signed)
 Pt stopped by [facility] office asking could he take CoQ-10. Pt advised on medication. Pt also requesting refill on Crestor . Medication sent to preferred pharmacy. Follow up appt made as well.

## 2024-03-07 ENCOUNTER — Encounter: Payer: Self-pay | Admitting: Cardiology

## 2024-03-27 ENCOUNTER — Ambulatory Visit: Attending: Cardiology | Admitting: Cardiology

## 2024-03-27 ENCOUNTER — Encounter: Payer: Self-pay | Admitting: Cardiology

## 2024-03-27 VITALS — BP 118/70 | HR 45 | Ht 74.0 in | Wt 230.0 lb

## 2024-03-27 DIAGNOSIS — G4730 Sleep apnea, unspecified: Secondary | ICD-10-CM

## 2024-03-27 DIAGNOSIS — I700 Atherosclerosis of aorta: Secondary | ICD-10-CM | POA: Diagnosis not present

## 2024-03-27 DIAGNOSIS — E66811 Obesity, class 1: Secondary | ICD-10-CM | POA: Diagnosis not present

## 2024-03-27 DIAGNOSIS — E782 Mixed hyperlipidemia: Secondary | ICD-10-CM | POA: Diagnosis not present

## 2024-03-27 NOTE — Patient Instructions (Addendum)
 Medication Instructions:  Your physician recommends that you continue on your current medications as directed. Please refer to the Current Medication list given to you today.  *If you need a refill on your cardiac medications before your next appointment, please call your pharmacy*  Lab Work: CMET, Lipids, LP(a) - please come fasting  If you have labs (blood work) drawn today and your tests are completely normal, you will receive your results only by: MyChart Message (if you have MyChart) OR A paper copy in the mail If you have any lab test that is abnormal or we need to change your treatment, we will call you to review the results.  Follow-Up: At [facility], you and your health needs are our priority.  As part of our continuing mission to provide you with exceptional heart care, our providers are all part of one team.  This team includes your primary Cardiologist (physician) and Advanced Practice Providers or APPs (Physician Assistants and Nurse Practitioners) who all work together to provide you with the care you need, when you need it.  Your next appointment:   1 year(s)  Provider:   Jennifer Crape, MD

## 2024-03-27 NOTE — Progress Notes (Signed)
 Cardiology Office Note:    Date:  03/27/2024    ID:  Kevin Arias, DOB Nov 19, 1970, MRN [id number]  PCP:  Arloa Elsie SAUNDERS, MD  Cardiologist:  Jennifer SAUNDERS Crape, MD   Referring MD: Arloa Elsie SAUNDERS, MD    ASSESSMENT:    1. Aortic atherosclerosis (HCC)   2. Sleep apnea, unspecified type   3. Obesity (BMI 30.0-34.9)   4. Mixed dyslipidemia    PLAN:    In order of problems listed above:  Primary prevention stressed with the patient.  Importance of compliance with diet medication stressed and patient verbalized standing. Aortic atherosclerosis: Stable I discussed findings with him at length. He was advised to walk at least half an hour a day on a daily basis. Mixed dyslipidemia: On lipid-lowering medications.  He will be back in the next few days for lipid check.  Lipids are not at goal.  I discussed diet and exercise and compliance and he promises to do better. Apnea: Sleep issues were discussed. Patient will be seen in follow-up appointment in 12 months or earlier if the patient has any concerns.    Medication Adjustments/Labs and Tests Ordered: Current medicines are reviewed at length with the patient today.  Concerns regarding medicines are outlined above.  No orders of the defined types were placed in this encounter.  No orders of the defined types were placed in this encounter.    No chief complaint on file.    History of Present Illness:    Kevin Arias is a 53 y.o. male.  Patient has past medical history of aortic atherosclerosis, mixed dyslipidemia sleep apnea and obesity.  He denies any problems at this time and takes care of activities of daily living.  No chest pain orthopnea or PND.  At the time of my evaluation, the patient is alert awake oriented and in no distress.  Past Medical History:  Diagnosis Date   Allergic rhinitis    Anemia 06/15/2022   May 27, 2010 Entered By: MEMORY GULL B Comment: beta THALASSEMIA MINOR   Asthma    Atypical chest pain  02/24/2018   Avulsion of left patellar tendon 12/26/2014   Chest pain    pinching type pain - occasional L arm discomfort   Chronic pain 05/05/2023   Chronic prostatitis 06/15/2022   Depression, unspecified 05/05/2023   Gastroesophageal reflux disease without esophagitis 02/24/2018   Headache 06/15/2022   Initial insomnia 05/05/2023   Low back pain 06/15/2022   Major depressive disorder, recurrent, moderate (HCC) 05/05/2023   Male erectile disorder (CODE) 05/05/2023   Male erectile dysfunction, unspecified 05/05/2023   Mixed dyslipidemia 06/16/2022   Mixed irritable bowel syndrome 05/05/2023   Moderate recurrent major depression (HCC) 05/05/2023   Other specified counseling 06/15/2022   Pain of cervical spine 12/07/2019   Patellar tendon rupture    Psychosexual dysfunction with inhibited sexual excitement 06/15/2022   PTSD (post-traumatic stress disorder)    Sleep apnea    does not use his c pap ( use to use c pap)   Tobacco use disorder 06/15/2022    Past Surgical History:  Procedure Laterality Date   PATELLAR TENDON REPAIR Left 12/26/2014   Procedure: PATELLA TENDON REPAIR ON THE LEFT;  Surgeon: Reyes Billing, MD;  Location: [facility];  Service: Orthopedics;  Laterality: Left;   UPPER GI ENDOSCOPY      Current Medications: Current Meds  Medication Sig   alfuzosin (UROXATRAL) 10 MG 24 hr tablet Take 10 mg by mouth daily with breakfast.  co-enzyme Q-10 30 MG capsule Take 30 mg by mouth daily.   fexofenadine-pseudoephedrine (ALLEGRA-D 24) 180-240 MG 24 hr tablet Take 1 tablet by mouth daily.   fluticasone (FLONASE) 50 MCG/ACT nasal spray Place 1 spray into both nostrils daily as needed for allergies or rhinitis.   Multiple Vitamin (MULTIVITAMIN WITH MINERALS) TABS tablet Take 1 tablet by mouth daily. Mens health pack of multi vitamins   rosuvastatin  (CRESTOR ) 10 MG tablet Take 1 tablet (10 mg total) by mouth daily.     Allergies:   Ciprofloxacin   Social History    Socioeconomic History   Marital status: Married    Spouse name: Not on file   Number of children: Not on file   Years of education: Not on file   Highest education level: Not on file  Occupational History   Not on file  Tobacco Use   Smoking status: Former    Current packs/day: 0.00    Types: Cigarettes    Quit date: 12/24/2004    Years since quitting: 19.2   Smokeless tobacco: Never  Vaping Use   Vaping status: Never Used  Substance and Sexual Activity   Alcohol use: Yes    Comment: occ   Drug use: No   Sexual activity: Not on file  Other Topics Concern   Not on file  Social History Narrative   Not on file   Social Drivers of Health   Financial Resource Strain: Not on file  Food Insecurity: Not on file  Transportation Needs: Not on file  Physical Activity: Not on file  Stress: Not on file  Social Connections: Not on file     Family History: The patient's family history includes Aneurysm in his sister; Anuerysm in his sister; Cancer in his mother; Diabetes in his sister; Hypertension in his mother and sister.  ROS:   Please see the history of present illness.    All other systems reviewed and are negative.  EKGs/Labs/Other Studies Reviewed:    The following studies were reviewed today: .SABRA   I discussed my findings with the patient at length.   Recent Labs: 08/12/2023: ALT 23; BUN 12; Creatinine, Ser 1.07; Potassium 4.3; Sodium 142  Recent Lipid Panel    Component Value Date/Time   CHOL 200 (H) 08/12/2023 0834   TRIG 172 (H) 08/12/2023 0834   HDL 57 08/12/2023 0834   CHOLHDL 3.5 08/12/2023 0834   LDLCALC 113 (H) 08/12/2023 0834    Physical Exam:    VS:  BP 118/70 (BP Location: Right Arm, Patient Position: Sitting, Cuff Size: Normal)   Pulse (!) 45   Ht 6' 2 (1.88 m)   Wt 230 lb 0.6 oz (104.3 kg)   SpO2 97%   BMI 29.54 kg/m     Wt Readings from Last 3 Encounters:  03/27/24 230 lb 0.6 oz (104.3 kg)  06/27/23 234 lb 1.3 oz (106.2 kg)   06/16/22 226 lb 0.6 oz (102.5 kg)     GEN: Patient is in no acute distress HEENT: Normal NECK: No JVD; No carotid bruits LYMPHATICS: No lymphadenopathy CARDIAC: Hear sounds regular, 2/6 systolic murmur at the apex. RESPIRATORY:  Clear to auscultation without rales, wheezing or rhonchi  ABDOMEN: Soft, non-tender, non-distended MUSCULOSKELETAL:  No edema; No deformity  SKIN: Warm and dry NEUROLOGIC:  Alert and oriented x 3 PSYCHIATRIC:  Normal affect   Signed, Jennifer JONELLE Crape, MD  03/27/2024 4:33 PM    [facility]

## 2024-03-29 ENCOUNTER — Ambulatory Visit: Payer: Self-pay | Admitting: Cardiology

## 2024-03-30 LAB — COMPREHENSIVE METABOLIC PANEL WITH GFR
ALT: 28 IU/L (ref 0–44)
AST: 21 IU/L (ref 0–40)
Albumin: 4.5 g/dL (ref 3.8–4.9)
Alkaline Phosphatase: 77 IU/L (ref 44–121)
BUN/Creatinine Ratio: 10 (ref 9–20)
BUN: 12 mg/dL (ref 6–24)
Bilirubin Total: 1.6 mg/dL — ABNORMAL HIGH (ref 0.0–1.2)
CO2: 23 mmol/L (ref 20–29)
Calcium: 9.9 mg/dL (ref 8.7–10.2)
Chloride: 102 mmol/L (ref 96–106)
Creatinine, Ser: 1.23 mg/dL (ref 0.76–1.27)
Globulin, Total: 2.3 g/dL (ref 1.5–4.5)
Glucose: 92 mg/dL (ref 70–99)
Potassium: 4.6 mmol/L (ref 3.5–5.2)
Sodium: 142 mmol/L (ref 134–144)
Total Protein: 6.8 g/dL (ref 6.0–8.5)
eGFR: 70 mL/min/1.73 (ref >59)

## 2024-03-30 LAB — LIPID PANEL
Chol/HDL Ratio: 2.2 ratio (ref 0.0–5.0)
Cholesterol, Total: 120 mg/dL (ref 100–199)
HDL: 55 mg/dL (ref >39)
LDL Chol Calc (NIH): 46 mg/dL (ref 0–99)
Triglycerides: 105 mg/dL (ref 0–149)
VLDL Cholesterol Cal: 19 mg/dL (ref 5–40)

## 2024-03-30 LAB — LIPOPROTEIN A (LPA): Lipoprotein (a): 34.5 nmol/L (ref <75.0)

## 2024-04-24 ENCOUNTER — Encounter: Payer: Self-pay | Admitting: *Deleted

## 2024-04-24 ENCOUNTER — Telehealth: Payer: Self-pay | Admitting: Cardiology

## 2024-04-24 NOTE — Telephone Encounter (Signed)
 Reports headaches started in December 2024 after starting crestor  10 mg. Reports that 2 days ago, he felt a raised area on the left side of his neck that was painful when he applied pressure. Reports he stopped crestor  2 days ago and his symptoms resolved. Denied fever. Advised to continue holding crestor  for the next 1-2 weeks to see if symptoms do not return. Advised if his symptoms return while holding crestor , that he should restart crestor . Advised that he should contact his PCP regarding these symptoms for an evaluation. Verbalized understanding of plan.

## 2024-04-24 NOTE — Telephone Encounter (Signed)
 Patient is in the lobby with questions about his crestor , he said he hasn't taken it in 2 days because he's been having headaches and he states he felt a lump/pain in his neck and is worried it could be from the crestor . When you get a chance if you could come speak with him, he's really concerned.
